# Patient Record
Sex: Male | Born: 1970
Health system: Southern US, Community
[De-identification: ages and names within clinical notes are randomized; demographics above are authoritative.]

## PROBLEM LIST (undated history)

## (undated) DIAGNOSIS — J45909 Unspecified asthma, uncomplicated: Secondary | ICD-10-CM

## (undated) HISTORY — PX: APPENDECTOMY: SHX54

## (undated) HISTORY — PX: PATELLAR TENDON REPAIR: SHX737

---

## 1999-12-28 ENCOUNTER — Emergency Department (HOSPITAL_COMMUNITY): Admission: EM | Admit: 1999-12-28 | Discharge: 1999-12-28 | Payer: Self-pay | Admitting: Emergency Medicine

## 1999-12-28 ENCOUNTER — Encounter: Payer: Self-pay | Admitting: Emergency Medicine

## 2001-04-23 ENCOUNTER — Emergency Department (HOSPITAL_COMMUNITY): Admission: EM | Admit: 2001-04-23 | Discharge: 2001-04-23 | Payer: Self-pay | Admitting: Emergency Medicine

## 2004-11-16 ENCOUNTER — Emergency Department (HOSPITAL_COMMUNITY): Admission: EM | Admit: 2004-11-16 | Discharge: 2004-11-16 | Payer: Self-pay | Admitting: Emergency Medicine

## 2004-11-30 ENCOUNTER — Observation Stay (HOSPITAL_COMMUNITY): Admission: RE | Admit: 2004-11-30 | Discharge: 2004-12-01 | Payer: Self-pay | Admitting: Orthopedic Surgery

## 2006-03-20 ENCOUNTER — Emergency Department (HOSPITAL_COMMUNITY): Admission: EM | Admit: 2006-03-20 | Discharge: 2006-03-20 | Payer: Self-pay | Admitting: Family Medicine

## 2007-05-10 ENCOUNTER — Emergency Department (HOSPITAL_COMMUNITY): Admission: EM | Admit: 2007-05-10 | Discharge: 2007-05-10 | Payer: Self-pay | Admitting: Emergency Medicine

## 2007-09-11 ENCOUNTER — Emergency Department (HOSPITAL_COMMUNITY): Admission: EM | Admit: 2007-09-11 | Discharge: 2007-09-11 | Payer: Self-pay | Admitting: Emergency Medicine

## 2008-01-27 ENCOUNTER — Emergency Department (HOSPITAL_COMMUNITY): Admission: EM | Admit: 2008-01-27 | Discharge: 2008-01-27 | Payer: Self-pay | Admitting: Emergency Medicine

## 2009-02-10 ENCOUNTER — Emergency Department (HOSPITAL_COMMUNITY): Admission: EM | Admit: 2009-02-10 | Discharge: 2009-02-10 | Payer: Self-pay | Admitting: *Deleted

## 2009-05-13 ENCOUNTER — Emergency Department (HOSPITAL_COMMUNITY): Admission: EM | Admit: 2009-05-13 | Discharge: 2009-05-13 | Payer: Self-pay | Admitting: Emergency Medicine

## 2009-11-05 ENCOUNTER — Emergency Department (HOSPITAL_COMMUNITY): Admission: EM | Admit: 2009-11-05 | Discharge: 2009-11-05 | Payer: Self-pay | Admitting: Family Medicine

## 2009-11-09 ENCOUNTER — Encounter (INDEPENDENT_AMBULATORY_CARE_PROVIDER_SITE_OTHER): Payer: Self-pay | Admitting: Family Medicine

## 2009-11-09 ENCOUNTER — Ambulatory Visit: Payer: Self-pay | Admitting: Internal Medicine

## 2009-11-09 LAB — CONVERTED CEMR LAB
ALT: 11 units/L (ref 0–53)
AST: 13 units/L (ref 0–37)
Albumin: 4.6 g/dL (ref 3.5–5.2)
Alkaline Phosphatase: 64 units/L (ref 39–117)
BUN: 7 mg/dL (ref 6–23)
CO2: 30 meq/L (ref 19–32)
Calcium: 9.4 mg/dL (ref 8.4–10.5)
Chloride: 100 meq/L (ref 96–112)
Cholesterol: 194 mg/dL (ref 0–200)
Creatinine, Ser: 1.01 mg/dL (ref 0.40–1.50)
Glucose, Bld: 96 mg/dL (ref 70–99)
HDL: 49 mg/dL (ref >39)
LDL Cholesterol: 129 mg/dL — ABNORMAL HIGH (ref 0–99)
Potassium: 4.3 meq/L (ref 3.5–5.3)
Sodium: 138 meq/L (ref 135–145)
TSH: 1.389 microintl units/mL (ref 0.350–4.500)
Total Bilirubin: 0.5 mg/dL (ref 0.3–1.2)
Total CHOL/HDL Ratio: 4
Total Protein: 7.7 g/dL (ref 6.0–8.3)
Triglycerides: 81 mg/dL (ref <150)
VLDL: 16 mg/dL (ref 0–40)
Vit D, 25-Hydroxy: 10 ng/mL — ABNORMAL LOW (ref 30–89)

## 2010-03-17 ENCOUNTER — Ambulatory Visit: Payer: Self-pay | Admitting: Internal Medicine

## 2011-01-26 NOTE — Op Note (Signed)
NAME:  Jeremy Randall, YEATTS NO.:  [id number]   MEDICAL RECORD NO.:  [id number]          PATIENT TYPE:  OBV   LOCATION:  0098                         FACILITY:  [facility]   PHYSICIAN:  Vania Rea. Supple, M.D.  DATE OF BIRTH:  08-20-1971   DATE OF PROCEDURE:  11/30/2004  DATE OF DISCHARGE:                                 OPERATIVE REPORT   PREOPERATIVE DIAGNOSIS:  Left patellar tendon rupture.   POSTOPERATIVE DIAGNOSIS:  Left patellar tendon rupture.   PROCEDURE:  Left patellar tendon repair.   SURGEON:  Vania Rea. Supple, M.D.   Threasa HeadsFrench Ana A. Shuford, P.A.-C.   ANESTHESIA:  LMA general anesthesia.   TOURNIQUET TIME:  Less than 60 minutes.   ESTIMATED BLOOD LOSS:  Minimal.   DRAINS:  None.   HISTORY:  Mr. Miklas is a 40 year old gentleman who injured his left knee  playing basketball approximately two weeks ago and experienced immediate  pain and inability to extend the knee.  He was seen at a local emergency  room, where x-rays were obtained, showing evidence for a patella Alta.  He  was placed in a knee immobilizer and followed up in our office yesterday, at  which time his examination did show significant swelling as well as  tenderness over the infrapatellar region as well as an inability to extend  the knee against gravity.  X-rays were reviewed, again, confirming patella  Alta.  The diagnosis is consistent with a patellar tendon rupture.  He is  brought to the operating room at this time for planned patellar tendon  repair.   Preoperatively, I discussed with Mr. Minnehan treatment options as well as  risks versus benefits thereof, possible complications, bleeding, infection,  neurovascular injury, DVT, PE, loss of knee motion, recurrence within the  rupture and the possible need for __________.  He understands and accepts  and agrees with our planned procedure.   PROCEDURE IN DETAIL:  After undergoing routine prep and evaluation, the  patient received  prophylactic antibiotics.  Placed supine on the operating  room table.  Underwent induction of LMA general anesthesia.  The tourniquet  applied to the left thigh and the left leg sterilely prepped and draped in a  standard fashion.  The leg was exsanguinated with the tourniquet inflated at  350 mmHg.  An anterior midline incision was then made from three  fingerbreadths above the patella to over the tibial tubercle.  Skin flaps  are mobilized.  There was significant scarring of the tissue planes, and we  carefully dividing the paratenon away from the underlying tendon and the  overlying subcutaneous tissues.  There was an obvious rupture of the mid  substance of the infrapatellar tendon with a 2-3 mm stump remaining at the  inferior pole of the patella.  The fat pad was freed from the patellar  tendon, and access of the joint was then gained, removing some yellow  synovial fluid, which was blood-tinged.  The joint was inspected and  irrigated.  The margin of the ruptured patellar tendon was then identified  and mobilized.  Two  modified Krackow grasping sutures were placed into both  the proximal and distal stumps.  These were then tied, allowing excellent  reapposition of the tendon stump and against one another, and care taken to  make sure the repair set was not over-reduced to prevent a patellar baja.  The retinaculum was then repaired with figure-of-eight #1 Vicryl sutures  medially and laterally, and 0 Vicryl was also used to over-sew the patellar  tendon margin.  Then 2-0 Vicryl in a running fashion was then used to repair  the paratenon over the repair site.  Then 2-0 Vicryl was used to close the  subcutaneous layer and intracuticular 3-0 Monocryl used to close the skin,  followed by Steri-Strips.  Dry dressings were applied over the left knee  followed by Ace bandages and placed in a knee immobilizer in full extension.  The tourniquet was then let down.  The patient was extubated and  taken to  the recovery room in stable condition.      KMS/MEDQ  D:  11/30/2004  T:  11/30/2004  Job:  [id number]

## 2011-06-06 LAB — POCT RAPID STREP A: Streptococcus, Group A Screen (Direct): NEGATIVE

## 2012-01-04 ENCOUNTER — Encounter (HOSPITAL_COMMUNITY): Payer: Self-pay

## 2012-01-04 ENCOUNTER — Emergency Department (INDEPENDENT_AMBULATORY_CARE_PROVIDER_SITE_OTHER)
Admission: EM | Admit: 2012-01-04 | Discharge: 2012-01-04 | Disposition: A | Discharge status: Home / Self Care | Payer: Self-pay | Source: Home / Self Care | Attending: Emergency Medicine | Admitting: Emergency Medicine

## 2012-01-04 DIAGNOSIS — K089 Disorder of teeth and supporting structures, unspecified: Secondary | ICD-10-CM

## 2012-01-04 DIAGNOSIS — K2970 Gastritis, unspecified, without bleeding: Secondary | ICD-10-CM

## 2012-01-04 DIAGNOSIS — K0889 Other specified disorders of teeth and supporting structures: Secondary | ICD-10-CM

## 2012-01-04 MED ORDER — PENICILLIN V POTASSIUM 500 MG PO TABS: 500.0000 mg | ORAL_TABLET | Freq: Four times a day (QID) | ORAL | Status: AC

## 2012-01-04 MED ORDER — MELOXICAM 15 MG PO TABS: 15.0000 mg | ORAL_TABLET | Freq: Every day | ORAL | Status: DC

## 2012-01-04 MED ORDER — SUCRALFATE 1 GM/10ML PO SUSP: 1.0000 g | Freq: Four times a day (QID) | ORAL | Status: DC

## 2012-01-04 MED ORDER — CHLORHEXIDINE GLUCONATE 0.12 % MT SOLN: OROMUCOSAL | Status: AC

## 2012-01-04 MED ORDER — LIDOCAINE VISCOUS 2 % MT SOLN: 10.0000 mL | OROMUCOSAL | Status: AC | PRN

## 2012-01-04 MED ORDER — HYDROCODONE-ACETAMINOPHEN 5-325 MG PO TABS: 2.0000 | ORAL_TABLET | ORAL | Status: AC | PRN

## 2012-01-04 NOTE — ED Provider Notes (Signed)
History     CSN: [id number]  Arrival date & time 01/04/12  1142   First MD Initiated Contact with Patient 01/04/12 1241      Chief Complaint  Patient presents with  . Nausea    (Consider location/radiation/quality/duration/timing/severity/associated sxs/prior treatment) HPI Comments: Patient reports one week of left upper toothache. Denies any recent trauma to the tooth. States this tooth is "bad," and bothers him from time to time. Reports left sided headache and ear pain secondary to the dental pain. No nausea, vomiting, facial swelling, nasal congestion.Marland Kitchen No photophobia, neck pain, neck stiffness. No change in hearing. Has been taking ibuprofen, Excedrin, and Goody's for this pain over the past week, and now states that  his stomach hurts, he feels nauseous. No vomiting, hematemesis. Reports decreased appetite. No melena, hematochezia. No abdominal distention or swelling. Pain is worse after taking medications. No alleviating factors. Hasn't tried anything for his symptoms. No history of peptic ulcer disease.  ROS as noted in HPI. All other ROS negative.   Patient is a 41 y.o. male presenting with tooth pain. The history is provided by the patient. No language interpreter was used.  Dental PainThe primary symptoms include mouth pain. The symptoms began 5 to 7 days ago. The symptoms are worsening. The symptoms are recurrent. The symptoms occur constantly.  Additional symptoms include: dental sensitivity to temperature, gum tenderness and ear pain. Additional symptoms do not include: gum swelling, purulent gums, trismus, jaw pain, facial swelling, trouble swallowing and pain with swallowing. Medical issues include: smoking and periodontal disease.    History reviewed. No pertinent past medical history.  Past Surgical History  Procedure Date  . Patellar tendon repair     History reviewed. No pertinent family history.  History  Substance Use Topics  . Smoking status: Current  Everyday Smoker  . Smokeless tobacco: Not on file  . Alcohol Use: Yes      Review of Systems  HENT: Positive for ear pain. Negative for facial swelling and trouble swallowing.     Allergies  Review of patient's allergies indicates no known allergies.  Home Medications   Current Outpatient Rx  Name Route Sig Dispense Refill  . CHLORHEXIDINE GLUCONATE 0.12 % MT SOLN  15 mL swish and spit bid 120 mL 0  . HYDROCODONE-ACETAMINOPHEN 5-325 MG PO TABS Oral Take 2 tablets by mouth every 4 (four) hours as needed for pain. 20 tablet 0  . LIDOCAINE VISCOUS 2 % MT SOLN Oral Take 10 mLs by mouth as needed for pain. Hold in mouth and spit. Do not swallow. 100 mL 0  . MELOXICAM 15 MG PO TABS Oral Take 1 tablet (15 mg total) by mouth daily. 14 tablet 0  . PENICILLIN V POTASSIUM 500 MG PO TABS Oral Take 1 tablet (500 mg total) by mouth 4 (four) times daily. X 10 days 40 tablet 0  . SUCRALFATE 1 GM/10ML PO SUSP Oral Take 10 mLs (1 g total) by mouth 4 (four) times daily. 10 mL before meals and before bedtime 240 mL 0    BP 123/79  Pulse 64  Temp(Src) 98.6 F (37 C) (Oral)  Resp 16  SpO2 96%  Physical Exam  Nursing note and vitals reviewed. Constitutional: He is oriented to person, place, and time. He appears well-developed and well-nourished.  HENT:  Head: Normocephalic and atraumatic. No trismus in the jaw.  Mouth/Throat: Oropharynx is clear and moist and mucous membranes are normal. Abnormal dentition. Dental caries present. No dental abscesses or uvula  swelling.    Eyes: Conjunctivae and EOM are normal.  Neck: Normal range of motion.  Cardiovascular: Normal rate, regular rhythm and normal heart sounds.   Pulmonary/Chest: Effort normal and breath sounds normal. No respiratory distress.  Abdominal: Soft. Normal appearance and bowel sounds are normal. He exhibits no distension and no mass. There is no rebound, no guarding and no CVA tenderness.    Musculoskeletal: Normal range of motion.    Neurological: He is alert and oriented to person, place, and time.  Skin: Skin is warm and dry.  Psychiatric: He has a normal mood and affect. His behavior is normal.    ED Course  Procedures (including critical care time)  Labs Reviewed - No data to display No results found.   1. Pain, dental   2. Gastritis       MDM  H&P most consistent with a dental caries. Infection. Will refer him to the dentist on call. Abdominal pain most consistent with gastritis, from all of the NSAID use. Patient is also smoker. Advised patient that smoking, drinking, will aggravate his symptoms. Discussed importance of smoking cessation.  home with Carafate. Will also send home with a short course of Norco. Sending home with viscous lidocaine for additional pain relief. Chlorhexidine mouthwash and penicillin for infection Will discontinue all of his NSAIDs, and start him on a once a day, long acting NSAID.  Luiz Blare, MD 01/04/12 [phone]

## 2012-01-04 NOTE — ED Notes (Signed)
C/o tooth ache for 1 week , using excedrin (sometimes w no food) and has felt nauseated ; denies injury to abdominal area

## 2012-01-04 NOTE — Discharge Instructions (Signed)
Go to www.goodrx.com to look up your medications. This will give you a list of where you can find your prescriptions at the most affordable prices.  ° °RESOURCE GUIDE ° °Dental Problems ° °Look up http://www.ncdental.org/ncds/Schedule.asp for a schedule of the [facility]'s free dental clinics called [facility]. They have clinics all around [place]. Get there early and be prepared to wait.  ° °[facility] °[address]  [place], [place] [address] °[phone] ° °[facility] °[address] °[place], [place] °[phone] ° °Patients with Medicaid: °[facility]                     [facility] °[address]                                [address] °Phone:  [phone]                                                  Phone:  [phone] ° °If unable to pay or uninsured, contact:  [facility] or [facility] to become qualified for the adult dental clinic. ° °

## 2012-05-10 ENCOUNTER — Encounter (HOSPITAL_COMMUNITY): Payer: Self-pay | Admitting: *Deleted

## 2012-05-10 ENCOUNTER — Observation Stay (HOSPITAL_COMMUNITY)
Admission: EM | Admit: 2012-05-10 | Discharge: 2012-05-12 | Disposition: A | Discharge status: Home / Self Care | Payer: Self-pay | Attending: Cardiovascular Disease | Admitting: Cardiovascular Disease

## 2012-05-10 DIAGNOSIS — R0789 Other chest pain: Secondary | ICD-10-CM

## 2012-05-10 DIAGNOSIS — I4891 Unspecified atrial fibrillation: Principal | ICD-10-CM

## 2012-05-10 DIAGNOSIS — R0602 Shortness of breath: Secondary | ICD-10-CM | POA: Insufficient documentation

## 2012-05-10 DIAGNOSIS — R42 Dizziness and giddiness: Secondary | ICD-10-CM

## 2012-05-10 DIAGNOSIS — R112 Nausea with vomiting, unspecified: Secondary | ICD-10-CM

## 2012-05-10 DIAGNOSIS — F17200 Nicotine dependence, unspecified, uncomplicated: Secondary | ICD-10-CM | POA: Insufficient documentation

## 2012-05-10 DIAGNOSIS — R61 Generalized hyperhidrosis: Secondary | ICD-10-CM | POA: Insufficient documentation

## 2012-05-10 DIAGNOSIS — R110 Nausea: Secondary | ICD-10-CM

## 2012-05-10 DIAGNOSIS — R1013 Epigastric pain: Secondary | ICD-10-CM | POA: Insufficient documentation

## 2012-05-10 DIAGNOSIS — E876 Hypokalemia: Secondary | ICD-10-CM

## 2012-05-10 DIAGNOSIS — R9431 Abnormal electrocardiogram [ECG] [EKG]: Secondary | ICD-10-CM

## 2012-05-10 DIAGNOSIS — I4581 Long QT syndrome: Secondary | ICD-10-CM | POA: Insufficient documentation

## 2012-05-10 DIAGNOSIS — R1110 Vomiting, unspecified: Secondary | ICD-10-CM

## 2012-05-10 DIAGNOSIS — R079 Chest pain, unspecified: Secondary | ICD-10-CM | POA: Insufficient documentation

## 2012-05-10 LAB — CBC
Hemoglobin: 15.5 g/dL (ref 13.0–17.0)
MCH: 31 pg (ref 26.0–34.0)
RBC: 5 MIL/uL (ref 4.22–5.81)

## 2012-05-10 LAB — CBC WITH DIFFERENTIAL/PLATELET
Basophils Absolute: 0.1 10*3/uL (ref 0.0–0.1)
HCT: 42.7 % (ref 39.0–52.0)
Lymphocytes Relative: 41 % (ref 12–46)
Monocytes Absolute: 0.8 10*3/uL (ref 0.1–1.0)
Neutro Abs: 5.1 10*3/uL (ref 1.7–7.7)
Neutrophils Relative %: 49 % (ref 43–77)
RDW: 13.4 % (ref 11.5–15.5)
WBC: 10.4 10*3/uL (ref 4.0–10.5)

## 2012-05-10 LAB — POCT I-STAT TROPONIN I: Troponin i, poc: 0.01 ng/mL (ref 0.00–0.08)

## 2012-05-10 LAB — POCT I-STAT, CHEM 8
BUN: 12 mg/dL (ref 6–23)
Calcium, Ion: 1.18 mmol/L (ref 1.12–1.23)
Chloride: 105 mEq/L (ref 96–112)
HCT: 47 % (ref 39.0–52.0)
Potassium: 3.3 mEq/L — ABNORMAL LOW (ref 3.5–5.1)
Sodium: 142 mEq/L (ref 135–145)

## 2012-05-10 MED ORDER — ONDANSETRON HCL 4 MG/2ML IJ SOLN
4.0000 mg | Freq: Once | INTRAMUSCULAR | Status: AC
  Administered 2012-05-10: 4 mg via INTRAVENOUS

## 2012-05-10 MED ORDER — METOPROLOL TARTRATE 1 MG/ML IV SOLN
INTRAVENOUS | Status: AC
  Administered 2012-05-10: 5 mg
  Filled 2012-05-10: qty 5

## 2012-05-10 MED ORDER — SUCRALFATE 1 GM/10ML PO SUSP
1.0000 g | Freq: Four times a day (QID) | ORAL | Status: DC
  Administered 2012-05-11 (×2): 1 g via ORAL
  Filled 2012-05-10 (×12): qty 10

## 2012-05-10 MED ORDER — ONDANSETRON HCL 4 MG PO TABS: 4.0000 mg | ORAL_TABLET | Freq: Four times a day (QID) | ORAL | Status: DC | PRN

## 2012-05-10 MED ORDER — HEPARIN SODIUM (PORCINE) 5000 UNIT/ML IJ SOLN
5000.0000 [IU] | Freq: Once | INTRAMUSCULAR | Status: AC
  Administered 2012-05-10: 5000 [IU] via INTRAVENOUS

## 2012-05-10 MED ORDER — POTASSIUM CHLORIDE IN NACL 40-0.9 MEQ/L-% IV SOLN
INTRAVENOUS | Status: DC
  Administered 2012-05-11: via INTRAVENOUS
  Filled 2012-05-10 (×4): qty 1000

## 2012-05-10 MED ORDER — ONDANSETRON HCL 4 MG/2ML IJ SOLN
INTRAMUSCULAR | Status: AC
  Filled 2012-05-10: qty 2

## 2012-05-10 MED ORDER — ASPIRIN 81 MG PO CHEW
324.0000 mg | CHEWABLE_TABLET | Freq: Once | ORAL | Status: AC
  Administered 2012-05-10: 324 mg via ORAL
  Filled 2012-05-10: qty 1

## 2012-05-10 MED ORDER — SODIUM CHLORIDE 0.9 % IJ SOLN
3.0000 mL | Freq: Two times a day (BID) | INTRAMUSCULAR | Status: DC
  Administered 2012-05-11 (×2): 3 mL via INTRAVENOUS

## 2012-05-10 MED ORDER — ASPIRIN 81 MG PO CHEW
CHEWABLE_TABLET | ORAL | Status: AC
  Administered 2012-05-10: 324 mg via ORAL
  Filled 2012-05-10: qty 4

## 2012-05-10 MED ORDER — ACETAMINOPHEN 650 MG RE SUPP: 650.0000 mg | Freq: Four times a day (QID) | RECTAL | Status: DC | PRN

## 2012-05-10 MED ORDER — ACETAMINOPHEN 325 MG PO TABS: 650.0000 mg | ORAL_TABLET | Freq: Four times a day (QID) | ORAL | Status: DC | PRN

## 2012-05-10 MED ORDER — ONDANSETRON HCL 4 MG/2ML IJ SOLN: 4.0000 mg | Freq: Four times a day (QID) | INTRAMUSCULAR | Status: DC | PRN

## 2012-05-10 MED ORDER — ZOLPIDEM TARTRATE 5 MG PO TABS: 5.0000 mg | ORAL_TABLET | Freq: Every evening | ORAL | Status: DC | PRN

## 2012-05-10 MED ORDER — ASPIRIN 81 MG PO CHEW
162.0000 mg | CHEWABLE_TABLET | Freq: Every day | ORAL | Status: DC
  Administered 2012-05-11 – 2012-05-12 (×2): 162 mg via ORAL
  Filled 2012-05-10 (×2): qty 2

## 2012-05-10 MED ORDER — HEPARIN SODIUM (PORCINE) 5000 UNIT/ML IJ SOLN
5000.0000 [IU] | Freq: Three times a day (TID) | INTRAMUSCULAR | Status: DC
  Administered 2012-05-11: 5000 [IU] via SUBCUTANEOUS
  Filled 2012-05-10 (×4): qty 1

## 2012-05-10 NOTE — ED Notes (Addendum)
Pt states dizzy and nausated since around 19:00. Pt denies pain but states he just got really diaphoretic, nauseated and then dizzy. Pt denies LOC, or weakness on side. Pt states he just doesn't feel right. Pt does admit to marjiuana use today.

## 2012-05-10 NOTE — ED Provider Notes (Signed)
History     CSN: [id number]  Arrival date & time 05/10/12  2012   First MD Initiated Contact with Patient 05/10/12 2108      Chief Complaint  Patient presents with  . Nausea  . Dizziness    Patient is a 41 year old male with no reported past medical history who presents with complaints of nausea dizziness diaphoresis and chest pressure. Patient states that symptoms onset approximately one hour prior to arrival. He denies any inciting event. Patient denies any history of prior chest pain in the past. Patient denies any fevers shortness of breath or any other symptoms.  (Consider location/radiation/quality/duration/timing/severity/associated sxs/prior treatment) Patient is a 41 y.o. male presenting with GI illness. The history is provided by the patient. No language interpreter was used.  GI Problem  This is a new problem. The current episode started 1 to 2 hours ago. The problem occurs continuously. The problem has been gradually worsening. There has been no fever. Associated symptoms include vomiting and sweats. He has tried nothing for the symptoms. The treatment provided no relief.    History reviewed. No pertinent past medical history.  Past Surgical History  Procedure Date  . Patellar tendon repair     History reviewed. No pertinent family history.  History  Substance Use Topics  . Smoking status: Current Everyday Smoker  . Smokeless tobacco: Not on file  . Alcohol Use: Yes      Review of Systems  Gastrointestinal: Positive for vomiting.  All other systems reviewed and are negative.    Allergies  Review of patient's allergies indicates no known allergies.  Home Medications   Current Outpatient Rx  Name Route Sig Dispense Refill  . MELOXICAM 15 MG PO TABS Oral Take 1 tablet (15 mg total) by mouth daily. 14 tablet 0  . SUCRALFATE 1 GM/10ML PO SUSP Oral Take 10 mLs (1 g total) by mouth 4 (four) times daily. 10 mL before meals and before bedtime 240 mL 0     BP 120/106  Pulse 113  Temp 97.8 F (36.6 C) (Oral)  Resp 18  SpO2 100%  Physical Exam  Nursing note and vitals reviewed. Constitutional: He is oriented to person, place, and time. He appears well-developed and well-nourished.  HENT:  Head: Normocephalic and atraumatic.  Right Ear: External ear normal.  Left Ear: External ear normal.  Eyes: Conjunctivae and EOM are normal. Pupils are equal, round, and reactive to light.  Neck: Normal range of motion. Neck supple.  Cardiovascular: S1 normal, S2 normal and intact distal pulses.  An irregularly irregular rhythm present. Tachycardia present.   Pulmonary/Chest: Effort normal and breath sounds normal.  Abdominal: Soft. Bowel sounds are normal.  Musculoskeletal: Normal range of motion. He exhibits no edema and no tenderness.  Neurological: He is alert and oriented to person, place, and time. He has normal reflexes.  Skin: Skin is warm. He is diaphoretic.  Psychiatric: He has a normal mood and affect.    ED Course  Procedures (including critical care time)  Labs Reviewed  CBC WITH DIFFERENTIAL - Abnormal; Notable for the following:    Lymphs Abs 4.3 (*)     All other components within normal limits  POCT I-STAT, CHEM 8 - Abnormal; Notable for the following:    Potassium 3.3 (*)     Glucose, Bld 188 (*)     All other components within normal limits  CBC - Abnormal; Notable for the following:    WBC 11.3 (*)     All  other components within normal limits  CREATININE, SERUM - Abnormal; Notable for the following:    GFR calc non Af Amer 78 (*)     All other components within normal limits  POCT I-STAT TROPONIN I  BASIC METABOLIC PANEL  TSH  URINE RAPID DRUG SCREEN (HOSP PERFORMED)   Dg Chest 2 View  05/11/2012  *RADIOLOGY REPORT*  Clinical Data: Atrial fibrillation  CHEST - 2 VIEW  Comparison: None  Findings: The heart size and mediastinal contours are within normal limits.  Both lungs are clear.  The visualized skeletal  structures are unremarkable.  IMPRESSION:  Negative exam.   Original Report Authenticated By: Rosealee Albee, M.D.        Date: 05/11/2012  Rate: 73   Rhythm: normal sinus rhythm  QRS Axis: normal  Intervals: normal  ST/T Wave abnormalities: early repolarization/ST elevations in inferior leads.    Conduction Disutrbances:none  Narrative Interpretation:   Old EKG Reviewed: none available    Date: 05/11/2012  Rate: 108  Rhythm: atrial fibrillation  QRS Axis: normal  Intervals: no pr  ST/T Wave abnormalities: early repolarization  Conduction Disutrbances:none  Narrative Interpretation:   Old EKG Reviewed: changes noted     1. Nausea   2. Vomiting   3. Lightheadedness   4. Diaphoresis   5. Chest pressure       MDM  Patient is a 41 year old male with no reported past medical history who presents with sudden onset of nausea, vomiting, lightheadedness, and diaphoresis. Upon arrival in the emergency department patient was noted to be afebrile vital signs unremarkable. However a EKG done in triage showed ST abnormalities in inferior leads, and thus was brought back immediately to trauma resuscitation room. Upon arrival in the resuscitation room EKG was reviewed and there were ST changes in the inferior leads however there was concern for possible early repolarization versus ischemia. Best EKG was repeated, repeat EKG showed atrial fibrillation with RVR and persistent ST changes. Thus code STEMI was called. Cardiology felt that the patient's EKGs consistent more with early repolarization and no need for activation of Cath Lab. Review of patient's including troponin showed no abnormalities. Review of the patient's chest x-ray showed no acute coronary pulmonary abnormality patient's tachycardia he was given metoprolol and status post treatment heart rate noted to be below 100. Patient's other vital signs remained stable and he was felt to warrant cardiology admission. Admitted without  acute event.        Johnney Ou, MD 05/11/12 0040

## 2012-05-10 NOTE — H&P (Signed)
[facility]       History and physical   Reason for admission: Atrial fibrillation with rapid ventricular response  Requesting Physician: Emergency room physician  Cardiologist: No prior  HPI: [name] with a past medical history significant for the absence of any known previous cardiac problems and no history of hypertension diabetes mellitus congestive heart failure stroke or TIA.  He had sudden onset of nausea and vomiting associated with diaphoresis and mild shortness of breath and some epigastric discomfort due to the retching, around 1800 hours. An ECG was performed initially to show sinus rhythm with very subtle generalized mild ST segment elevation suggestive of early repolarization versus pericarditis. Shortly after arriving to the emergency room a second EKG showed atrial fibrillation. At some point a code STEMI was called but the patient a new echocardiogram were evaluated by Dr. Shawnie Pons, and a code STEMI was canceled.  He has not had any chest tightness or pain whatsoever this afternoon. I have asked him to several times. There is a notation in the chart that he mentioned to someone that he had chest tightness. He tells in this is not accurate.  His review of systems is primary significant for the fact that he has had palpitations off and on in the past. Otherwise he denies any dyspnea at rest or with exertion, chest pain at rest or with exertion, fatigue or intolerance to activity, recent major weight changes, intolerance to heat or cold, changes to hair 4 skin texture, mood swings, gastrointestinal bleeding, abdominal pain, diarrhea, constipation or dysphagia, polyuria, polydipsia, frequency, urgency hematuria, dysuria, fever, chills,cough, hemoptysis, pleuritic chest pain or wheezing, neurological deficits, intermittent claudication or erectile dysfunction.  PMHx:  History reviewed. No pertinent past medical history. Past  Surgical History  Procedure Date  . Patellar tendon repair     FAMHx: History reviewed. No pertinent family history.specifically negative for premature coronary disease congestive heart failure atrial fibrillation or sudden cardiac death  SOCHx:  reports that he has been smoking.  He does not have any smokeless tobacco history on file. He reports that he drinks alcohol. He reports that he uses illicit drugs (Marijuana).  ALLERGIES: No Known Allergies  ROS: His review of systems is primary significant for the fact that he has had palpitations off and on in the past. Otherwise he denies any dyspnea at rest or with exertion, chest pain at rest or with exertion, fatigue or intolerance to activity, recent major weight changes, intolerance to heat or cold, changes to hair 4 skin texture, mood swings, gastrointestinal bleeding, abdominal pain, diarrhea, constipation or dysphagia, polyuria, polydipsia, frequency, urgency hematuria, dysuria, fever, chills,cough, hemoptysis, pleuritic chest pain or wheezing, neurological deficits, intermittent claudication or erectile dysfunction.   HOME MEDICATIONS: Carafate  HOSPITAL MEDICATIONS: I have reviewed the patient's current medications.  VITALS: Blood pressure 106/68, pulse 67, temperature 97.8 F (36.6 C), temperature source Oral, resp. rate 8, SpO2 100.00%.  PHYSICAL EXAM: General appearance: alert, cooperative and no distress Neck: no adenopathy, no carotid bruit, no JVD, supple, symmetrical, trachea midline and thyroid not enlarged, symmetric, no tenderness/mass/nodules Lungs: clear to auscultation bilaterally Heart: irregularly irregular rhythm, normal first and second heart sounds, no murmurs or rubs or gallops Abdomen: soft, non-tender; bowel sounds normal; no masses,  no organomegaly Extremities: extremities normal, atraumatic, no cyanosis or edema Pulses: 2+ and symmetric Skin: Skin color, texture, turgor normal. No rashes or  lesions Neurologic: Alert and oriented X  3, normal strength and tone. Normal symmetric reflexes. Normal coordination and gait  LABS: Results for orders placed during the hospital encounter of 05/10/12 (from the past 48 hour(s))  CBC WITH DIFFERENTIAL     Status: Abnormal   Collection Time   05/10/12  8:28 PM      Component Value Range Comment   WBC 10.4  4.0 - 10.5 K/uL    RBC 4.90  4.22 - 5.81 MIL/uL    Hemoglobin 15.1  13.0 - 17.0 g/dL    HCT 40.9  81.1 - 91.4 %    MCV 87.1  78.0 - 100.0 fL    MCH 30.8  26.0 - 34.0 pg    MCHC 35.4  30.0 - 36.0 g/dL    RDW 78.2  95.6 - 21.3 %    Platelets 228  150 - 400 K/uL    Neutrophils Relative 49  43 - 77 %    Neutro Abs 5.1  1.7 - 7.7 K/uL    Lymphocytes Relative 41  12 - 46 %    Lymphs Abs 4.3 (*) 0.7 - 4.0 K/uL    Monocytes Relative 8  3 - 12 %    Monocytes Absolute 0.8  0.1 - 1.0 K/uL    Eosinophils Relative 1  0 - 5 %    Eosinophils Absolute 0.1  0.0 - 0.7 K/uL    Basophils Relative 1  0 - 1 %    Basophils Absolute 0.1  0.0 - 0.1 K/uL   POCT I-STAT TROPONIN I     Status: Normal   Collection Time   05/10/12  8:43 PM      Component Value Range Comment   Troponin i, poc 0.01  0.00 - 0.08 ng/mL    Comment 3            POCT I-STAT, CHEM 8     Status: Abnormal   Collection Time   05/10/12  8:44 PM      Component Value Range Comment   Sodium 142  135 - 145 mEq/L    Potassium 3.3 (*) 3.5 - 5.1 mEq/L    Chloride 105  96 - 112 mEq/L    BUN 12  6 - 23 mg/dL    Creatinine, Ser 0.86  0.50 - 1.35 mg/dL    Glucose, Bld 578 (*) 70 - 99 mg/dL    Calcium, Ion 4.69  6.29 - 1.23 mmol/L    TCO2 22  0 - 100 mmol/L    Hemoglobin 16.0  13.0 - 17.0 g/dL    HCT 52.8  41.3 - 24.4 %     IMAGING: No results found.  DIAGNOSES: Principal Problem:  *Atrial fibrillation Active Problems:  Abnormal finding on EKG  Nausea and vomiting  Prolonged Q-T interval on ECG  Hypokalemia   IMPRESSION: 1. Atrial fibrillation with spontaneously controlled  ventricular response 2. Nausea and vomiting suspect gastroenteritis 3. Hypokalemia, likely due to vomiting 4. Long QT interval, likely due to hypokalemia  RECOMMENDATION: 1. Admit for evaluation for structural heart disease and to establish need for anticoagulation, to correct metabolic abnormalities and to consider possible cardioversion  It is possible that the patient's arrhythmia will spontaneously revert following correction of the electrolyte abnormality.  It is surprising that at his age his atrial fibrillation ventricular rate response is so slow. We'll definitely need to check thyroid function studies. An echocardiogram was also ordered  Time Spent Directly with Patient: 45 minutes  Chrystie Nose, MD, [facility] Attending Cardiologist  [facility]  Jeremy Randall 05/10/2012, 11:15 PM

## 2012-05-10 NOTE — ED Notes (Signed)
Stemi cancelled

## 2012-05-10 NOTE — ED Notes (Signed)
Called Code STEMI

## 2012-05-10 NOTE — ED Notes (Signed)
Pt c/o nausea, vomiting, diaphoresis, lightheadedness. Pt states "my chest feels tight." Denies pain currently.

## 2012-05-10 NOTE — Progress Notes (Signed)
Responded to code stemi page.  Pt was waiting and shivering. I introduced myself and asked if I could get another blanket for him. After getting his blanket, pt expressed apprehension at the presence of a Chaplain. I assured him I was there to offer comfort and spiritual care. He smiled and said thank you. I asked if there was anything more I could do for him while we waited for his mother and sister to arrive. He said just pray for me. Shortly after prayer his mother and sister arrived.  They were very attentive in continuing to provide comfort and care to pt.  Marjory Lies, Chaplain May 10, 2012

## 2012-05-11 ENCOUNTER — Encounter (HOSPITAL_COMMUNITY): Payer: Self-pay | Admitting: *Deleted

## 2012-05-11 ENCOUNTER — Observation Stay (HOSPITAL_COMMUNITY): Payer: Self-pay

## 2012-05-11 LAB — BASIC METABOLIC PANEL
GFR calc Af Amer: >90 mL/min (ref >90)
GFR calc non Af Amer: 82 mL/min — ABNORMAL LOW (ref >90)
Glucose, Bld: 90 mg/dL (ref 70–99)
Potassium: 4.1 mEq/L (ref 3.5–5.1)
Sodium: 139 mEq/L (ref 135–145)

## 2012-05-11 LAB — RAPID URINE DRUG SCREEN, HOSP PERFORMED
Cocaine: NOT DETECTED
Opiates: POSITIVE — AB
Tetrahydrocannabinol: POSITIVE — AB

## 2012-05-11 LAB — CREATININE, SERUM: Creatinine, Ser: 1.15 mg/dL (ref 0.50–1.35)

## 2012-05-11 MED ORDER — FLECAINIDE ACETATE 100 MG PO TABS
300.0000 mg | ORAL_TABLET | Freq: Once | ORAL | Status: AC
  Administered 2012-05-11: 300 mg via ORAL
  Filled 2012-05-11: qty 3

## 2012-05-11 MED ORDER — ENOXAPARIN SODIUM 100 MG/ML ~~LOC~~ SOLN
1.0000 mg/kg | Freq: Two times a day (BID) | SUBCUTANEOUS | Status: DC
  Administered 2012-05-11: 90 mg via SUBCUTANEOUS
  Filled 2012-05-11 (×4): qty 1

## 2012-05-11 NOTE — Progress Notes (Signed)
[facility]  DAILY PROGRESS NOTE   Subjective:  Nausea and vomiting have resolved.  Objective:  Temp:  [97.8 F (36.6 C)-98.4 F (36.9 C)] 98.4 F (36.9 C) (09/01 0457) Pulse Rate:  [61-113] 81  (09/01 0457) Resp:  [8-20] 18  (09/01 0457) BP: (94-130)/(61-106) 106/65 mmHg (09/01 0457) SpO2:  [95 %-100 %] 95 % (09/01 0457) Weight:  [90.7 kg (199 lb 15.3 oz)] 90.7 kg (199 lb 15.3 oz) (09/01 0111) Weight change:   Intake/Output from previous day:    Intake/Output from this shift: Total I/O In: 240 [P.O.:240] Out: -   Medications: Current Facility-Administered Medications  Medication Dose Route Frequency Provider Last Rate Last Dose  . 0.9 % NaCl with KCl 40 mEq / L  infusion   Intravenous Continuous Jshawn Hurta, MD 100 mL/hr at 05/11/12 0028    . acetaminophen (TYLENOL) tablet 650 mg  650 mg Oral Q6H PRN Sergio Zawislak, MD       Or  . acetaminophen (TYLENOL) suppository 650 mg  650 mg Rectal Q6H PRN Donavin Audino, MD      . aspirin chewable tablet 162 mg  162 mg Oral Daily Montserrat Shek, MD   162 mg at 05/11/12 0957  . aspirin chewable tablet 324 mg  324 mg Oral Once Nelia Shi, MD   324 mg at 05/10/12 2103  . heparin injection 5,000 Units  5,000 Units Intravenous Once Nelia Shi, MD   5,000 Units at 05/10/12 2057  . heparin injection 5,000 Units  5,000 Units Subcutaneous Q8H Jamy Whyte, MD   5,000 Units at 05/11/12 0654  . metoprolol (LOPRESSOR) 1 MG/ML injection        5 mg at 05/10/12 2100  . ondansetron (ZOFRAN) injection 4 mg  4 mg Intravenous Once Nelia Shi, MD   4 mg at 05/10/12 2048  . ondansetron (ZOFRAN) tablet 4 mg  4 mg Oral Q6H PRN Loman Logan, MD       Or  . ondansetron (ZOFRAN) injection 4 mg  4 mg Intravenous Q6H PRN Spruha Weight, MD      . sodium chloride 0.9 % injection 3 mL  3 mL Intravenous Q12H Costantino Kohlbeck, MD   3 mL at 05/11/12 1000  . sucralfate (CARAFATE) 1 GM/10ML suspension 1 g  1 g Oral  QID Reanne Nellums, MD   1 g at 05/11/12 0958  . zolpidem (AMBIEN) tablet 5 mg  5 mg Oral QHS PRN,MR X 1 Jadie Allington, MD        Physical Exam: General appearance: alert, cooperative and no distress Neck: no adenopathy, no carotid bruit, no JVD, supple, symmetrical, trachea midline and thyroid not enlarged, symmetric, no tenderness/mass/nodules Lungs: clear to auscultation bilaterally Heart: irregularly irregular rhythm, S1, S2 normal, no click, no rub and no murmur Abdomen: soft, non-tender; bowel sounds normal; no masses,  no organomegaly Extremities: extremities normal, atraumatic, no cyanosis or edema Pulses: 2+ and symmetric Skin: Skin color, texture, turgor normal. No rashes or lesions Neurologic: Alert and oriented X 3, normal strength and tone. Normal symmetric reflexes. Normal coordination and gait  Lab Results: Results for orders placed during the hospital encounter of 05/10/12 (from the past 48 hour(s))  CBC WITH DIFFERENTIAL     Status: Abnormal   Collection Time   05/10/12  8:28 PM      Component Value Range Comment   WBC 10.4  4.0 - 10.5 K/uL    RBC 4.90  4.22 - 5.81  MIL/uL    Hemoglobin 15.1  13.0 - 17.0 g/dL    HCT 08.6  57.8 - 46.9 %    MCV 87.1  78.0 - 100.0 fL    MCH 30.8  26.0 - 34.0 pg    MCHC 35.4  30.0 - 36.0 g/dL    RDW 62.9  52.8 - 41.3 %    Platelets 228  150 - 400 K/uL    Neutrophils Relative 49  43 - 77 %    Neutro Abs 5.1  1.7 - 7.7 K/uL    Lymphocytes Relative 41  12 - 46 %    Lymphs Abs 4.3 (*) 0.7 - 4.0 K/uL    Monocytes Relative 8  3 - 12 %    Monocytes Absolute 0.8  0.1 - 1.0 K/uL    Eosinophils Relative 1  0 - 5 %    Eosinophils Absolute 0.1  0.0 - 0.7 K/uL    Basophils Relative 1  0 - 1 %    Basophils Absolute 0.1  0.0 - 0.1 K/uL   POCT I-STAT TROPONIN I     Status: Normal   Collection Time   05/10/12  8:43 PM      Component Value Range Comment   Troponin i, poc 0.01  0.00 - 0.08 ng/mL    Comment 3            POCT I-STAT, CHEM 8      Status: Abnormal   Collection Time   05/10/12  8:44 PM      Component Value Range Comment   Sodium 142  135 - 145 mEq/L    Potassium 3.3 (*) 3.5 - 5.1 mEq/L    Chloride 105  96 - 112 mEq/L    BUN 12  6 - 23 mg/dL    Creatinine, Ser 2.44  0.50 - 1.35 mg/dL    Glucose, Bld 010 (*) 70 - 99 mg/dL    Calcium, Ion 2.72  5.36 - 1.23 mmol/L    TCO2 22  0 - 100 mmol/L    Hemoglobin 16.0  13.0 - 17.0 g/dL    HCT 64.4  03.4 - 74.2 %   CBC     Status: Abnormal   Collection Time   05/10/12 11:29 PM      Component Value Range Comment   WBC 11.3 (*) 4.0 - 10.5 K/uL    RBC 5.00  4.22 - 5.81 MIL/uL    Hemoglobin 15.5  13.0 - 17.0 g/dL    HCT 59.5  63.8 - 75.6 %    MCV 88.4  78.0 - 100.0 fL    MCH 31.0  26.0 - 34.0 pg    MCHC 35.1  30.0 - 36.0 g/dL    RDW 43.3  29.5 - 18.8 %    Platelets 233  150 - 400 K/uL   CREATININE, SERUM     Status: Abnormal   Collection Time   05/10/12 11:29 PM      Component Value Range Comment   Creatinine, Ser 1.15  0.50 - 1.35 mg/dL    GFR calc non Af Amer 78 (*) >90 mL/min    GFR calc Af Amer >90  >90 mL/min   URINE RAPID DRUG SCREEN (HOSP PERFORMED)     Status: Abnormal   Collection Time   05/10/12 11:49 PM      Component Value Range Comment   Opiates POSITIVE (*) NONE DETECTED    Cocaine NONE DETECTED  NONE DETECTED    Benzodiazepines  NONE DETECTED  NONE DETECTED    Amphetamines NONE DETECTED  NONE DETECTED    Tetrahydrocannabinol POSITIVE (*) NONE DETECTED    Barbiturates NONE DETECTED  NONE DETECTED   BASIC METABOLIC PANEL     Status: Abnormal   Collection Time   05/11/12  6:05 AM      Component Value Range Comment   Sodium 139  135 - 145 mEq/L    Potassium 4.1  3.5 - 5.1 mEq/L    Chloride 107  96 - 112 mEq/L    CO2 26  19 - 32 mEq/L    Glucose, Bld 90  70 - 99 mg/dL    BUN 13  6 - 23 mg/dL    Creatinine, Ser 1.61  0.50 - 1.35 mg/dL    Calcium 9.0  8.4 - 09.6 mg/dL    GFR calc non Af Amer 82 (*) >90 mL/min    GFR calc Af Amer >90  >90 mL/min     TROPONIN I     Status: Normal   Collection Time   05/11/12  8:38 AM      Component Value Range Comment   Troponin I <0.30  <0.30 ng/mL     Imaging: Dg Chest 2 View  05/11/2012  *RADIOLOGY REPORT*  Clinical Data: Atrial fibrillation  CHEST - 2 VIEW  Comparison: None  Findings: The heart size and mediastinal contours are within normal limits.  Both lungs are clear.  The visualized skeletal structures are unremarkable.  IMPRESSION:  Negative exam.   Original Report Authenticated By: Rosealee Albee, M.D.     Assessment:  1. Principal Problem: 2.  *Atrial fibrillation 3. Active Problems: 4.  Abnormal finding on EKG 5.  Nausea and vomiting 6.  Prolonged Q-T interval on ECG 7.  Hypokalemia 8.   Plan:  1. AFib persists despite resolution of electrolyte abnormalities and QT normalization. Surprisingly slow ventricular response despite no meds. Echo pending. TSH pending but clinically more likely euthyroid.  ECG still with mild diffuse ST elevation, but appears most consistent with early repolarization than with pericarditis. Also no pleurisy and no rub.  Start full dose anticoagulation. Antiarrhythmics will likely help, but need to evaluate echo for structural abnormalities before picking the right agent. Flecainide is an appealing option as long as EF is normal.  Time Spent Directly with Patient:  30 minutes  Length of Stay:  LOS: 1 day    Brendt Dible 05/11/2012, 10:25 AM

## 2012-05-11 NOTE — Progress Notes (Signed)
Echo is normal (normal LV size and function, normal size left atrium, no valvular disease). Will try bolus dose flecainide to convert to NSR.  Thurmon Fair, MD, [facility] [phone] office [phone] pager

## 2012-05-11 NOTE — Progress Notes (Signed)
  Echocardiogram 2D Echocardiogram has been performed.  Jorje Guild 05/11/2012, 2:35 PM

## 2012-05-11 NOTE — Progress Notes (Signed)
05/11/2012 3:51 PM Nursing note Pt. Viewed video #[phone] as well as received Off the beat book. Questions and concerns addressed. Will continue to monitor.  Lorin Gawron, Blanchard Kelch

## 2012-05-12 MED ORDER — ASPIRIN 81 MG PO CHEW: 162.0000 mg | CHEWABLE_TABLET | Freq: Every day | ORAL | Status: AC

## 2012-05-12 NOTE — Progress Notes (Signed)
[facility]  Subjective: Feeling better.  Objective: Vital signs in last 24 hours: Temp:  [98.2 F (36.8 C)-98.8 F (37.1 C)] 98.2 F (36.8 C) (09/02 0416) Pulse Rate:  [64-85] 64  (09/02 0416) Resp:  [18] 18  (09/02 0416) BP: (112-127)/(67-78) 118/74 mmHg (09/02 0416) SpO2:  [95 %-100 %] 100 % (09/02 0416) Last BM Date: 05/10/12  Intake/Output from previous day: 09/01 0701 - 09/02 0700 In: 480 [P.O.:480] Out: -  Intake/Output this shift:    Medications Current Facility-Administered Medications  Medication Dose Route Frequency Provider Last Rate Last Dose  . acetaminophen (TYLENOL) tablet 650 mg  650 mg Oral Q6H PRN Susano Cleckler, MD       Or  . acetaminophen (TYLENOL) suppository 650 mg  650 mg Rectal Q6H PRN Harsimran Westman, MD      . aspirin chewable tablet 162 mg  162 mg Oral Daily Fayelynn Distel, MD   162 mg at 05/11/12 0957  . enoxaparin (LOVENOX) injection 90 mg  1 mg/kg Subcutaneous Q12H Ramadan Couey, MD   90 mg at 05/11/12 1223  . flecainide (TAMBOCOR) tablet 300 mg  300 mg Oral Once Thurmon Fair, MD   300 mg at 05/11/12 1802  . ondansetron (ZOFRAN) tablet 4 mg  4 mg Oral Q6H PRN Anastasio Wogan, MD       Or  . ondansetron (ZOFRAN) injection 4 mg  4 mg Intravenous Q6H PRN Wannetta Langland, MD      . sodium chloride 0.9 % injection 3 mL  3 mL Intravenous Q12H Maryum Batterson, MD   3 mL at 05/11/12 2114  . sucralfate (CARAFATE) 1 GM/10ML suspension 1 g  1 g Oral QID Lolah Coghlan, MD   1 g at 05/11/12 0958  . zolpidem (AMBIEN) tablet 5 mg  5 mg Oral QHS PRN,MR X 1 Knoxx Boeding, MD      . DISCONTD: 0.9 % NaCl with KCl 40 mEq / L  infusion   Intravenous Continuous Anthonio Mizzell, MD 100 mL/hr at 05/11/12 0028    . DISCONTD: heparin injection 5,000 Units  5,000 Units Subcutaneous Q8H Diona Peregoy, MD   5,000 Units at 05/11/12 0654    PE: General appearance: alert, cooperative and no distress Lungs: clear to auscultation  bilaterally Heart: regular rate and rhythm, S1, S2 normal, no murmur, click, rub or gallop Extremities: no LEE Pulses: 2+ and symmetric Skin: Warm and dry Neurologic: Grossly normal  Lab Results:   Basename 05/10/12 2329 05/10/12 2044 05/10/12 2028  WBC 11.3* -- 10.4  HGB 15.5 16.0 15.1  HCT 44.2 47.0 42.7  PLT 233 -- 228   BMET  Basename 05/11/12 0605 05/10/12 2329 05/10/12 2044  NA 139 -- 142  K 4.1 -- 3.3*  CL 107 -- 105  CO2 26 -- --  GLUCOSE 90 -- 188*  BUN 13 -- 12  CREATININE 1.11 1.15 1.20  CALCIUM 9.0 -- --     Assessment/Plan  Principal Problem:  *Atrial fibrillation Active Problems:  Abnormal finding on EKG  Nausea and vomiting  Prolonged Q-T interval on ECG  Hypokalemia  Plan:  Converted to NSR in the 60's after Flecainde.  BP stable.  Potassium improved.     LOS: 2 days    HAGER, BRYAN 05/12/2012 9:52 AM  I have seen and examined the patient along with Wilburt Finlay, PA.  I have reviewed the chart, notes and new data.  I agree with PA's note.  Converted promptly to NSR after  flecainide. Feels well.  PLAN: DC home. We can use flecainide as an outpatient "pill in the pocket" if symptomatic AF recurs. ASA 162 mg daily for embolism prevention. F/U in office in 1 month.  Thurmon Fair, MD, [facility] [phone] 05/12/2012, 10:09 AM

## 2012-05-12 NOTE — Discharge Summary (Signed)
Physician Discharge Summary  Patient ID: Jeremy Randall MRN: [id number] DOB/AGE: 40-Aug-1972 40 y.o.  Admit date: 05/10/2012 Discharge date: 05/12/2012  Admission Diagnoses:  Afib  Discharge Diagnoses:  Principal Problem:  *Atrial fibrillation Active Problems:  Abnormal finding on EKG  Nausea and vomiting  Prolonged Q-T interval on ECG  Hypokalemia   Discharged Condition: stable  Hospital Course:   [name] with a past medical history significant for the absence of any known previous cardiac problems and no history of hypertension diabetes mellitus congestive heart failure stroke or TIA.  He had sudden onset of nausea and vomiting associated with diaphoresis and mild shortness of breath and some epigastric discomfort due to the retching, around 1800 hours. An ECG was performed initially to show sinus rhythm with very subtle generalized mild ST segment elevation suggestive of early repolarization versus pericarditis. Shortly after arriving to the emergency room a second EKG showed atrial fibrillation. At some point a code STEMI was called but the patient a new echocardiogram were evaluated by Dr. Shawnie Pons, and a code STEMI was canceled.  There is a notation in the chart that he mentioned to someone that he had chest tightness. He stated this was not accurate.   His review of systems is primary significant for the fact that he has had palpitations off and on in the past. Otherwise he denied any dyspnea at rest or with exertion, chest pain at rest or with exertion, fatigue or intolerance to activity, recent major weight changes, intolerance to heat or cold, changes to hair or skin texture, mood swings, gastrointestinal bleeding, abdominal pain, diarrhea, constipation or dysphagia, polyuria, polydipsia, frequency, urgency hematuria, dysuria, fever, chills,cough, hemoptysis, pleuritic chest pain or wheezing, neurological deficits, intermittent claudication or erectile  dysfunction.  He was admitted for evaluation.  2D echo revealed normal EF and wall motion.  UDS was positive for opiates and marijuana.  TSH was 0.724.  He was given 300 mg of flecainide and converted to NSR within the next hour.  He was been seen by Dr. Royann Shivers and felt to be stable for DC home.  FU in one month.     Consults: None  Significant Diagnostic Studies:  Echo Study Conclusions  Left ventricle: The cavity size was normal. Wall thickness was normal. Systolic function was normal. The estimated ejection fraction was in the range of 55% to 60%. Wall motion was normal; there were no regional wall motion abnormalities. Atrial fibrillation limitsevaluation of LV diastolic function, but DTI velocities suggest normal diastolic function.  BMET    Component Value Date/Time   NA 139 05/11/2012 0605   K 4.1 05/11/2012 0605   CL 107 05/11/2012 0605   CO2 26 05/11/2012 0605   GLUCOSE 90 05/11/2012 0605   BUN 13 05/11/2012 0605   CREATININE 1.11 05/11/2012 0605   CALCIUM 9.0 05/11/2012 0605   GFRNONAA 82* 05/11/2012 0605   GFRAA >90 05/11/2012 0605    CBC    Component Value Date/Time   WBC 11.3* 05/10/2012 2329   RBC 5.00 05/10/2012 2329   HGB 15.5 05/10/2012 2329   HCT 44.2 05/10/2012 2329   PLT 233 05/10/2012 2329   MCV 88.4 05/10/2012 2329   MCH 31.0 05/10/2012 2329   MCHC 35.1 05/10/2012 2329   RDW 13.7 05/10/2012 2329   LYMPHSABS 4.3* 05/10/2012 2028   MONOABS 0.8 05/10/2012 2028   EOSABS 0.1 05/10/2012 2028   BASOSABS 0.1 05/10/2012 2028   Drugs of Abuse  Component Value Date/Time   LABOPIA POSITIVE* 05/10/2012 2349   COCAINSCRNUR NONE DETECTED 05/10/2012 2349   LABBENZ NONE DETECTED 05/10/2012 2349   AMPHETMU NONE DETECTED 05/10/2012 2349   THCU POSITIVE* 05/10/2012 2349   LABBARB NONE DETECTED 05/10/2012 2349    TSH 0.724     Treatments: Flecanide  Discharge Exam: Blood pressure 118/74, pulse 64, temperature 98.2 F (36.8 C), temperature source Oral, resp. rate 18, height 6\' 2"   (1.88 m), weight 90.7 kg (199 lb 15.3 oz), SpO2 100.00%.   Disposition: 01-Home or Self Care  Discharge Orders    Future Orders Please Complete By Expires   Diet - low sodium heart healthy      Increase activity slowly        Medication List  As of 05/12/2012 10:18 AM   STOP taking these medications         sucralfate 1 GM/10ML suspension         TAKE these medications         aspirin 81 MG chewable tablet   Chew 2 tablets (162 mg total) by mouth daily.           Follow-up Information    Follow up with Thurmon Fair, MD. (Our office will call you with the appt date and time.)    Contact information:   [address] [place] [address] [phone]          Signed: Wilburt Finlay 05/12/2012, 10:18 AM

## 2012-05-17 NOTE — ED Provider Notes (Signed)
I saw and evaluated the patient, reviewed the resident's note and I agree with the findings and plan.   .Face to face Exam:  General:  Awake HEENT:  Atraumatic Resp:  Normal effort Abd:  Nondistended Neuro:No focal weakness Lymph: No adenopathy   Nelia Shi, MD 05/17/12 2249

## 2013-04-30 IMAGING — CR DG CHEST 2V
2 series · 2 of 2 positions shown · non-contrast
Comparison: None

CLINICAL DATA: Atrial fibrillation

CHEST - 2 VIEW

[w chest pa]
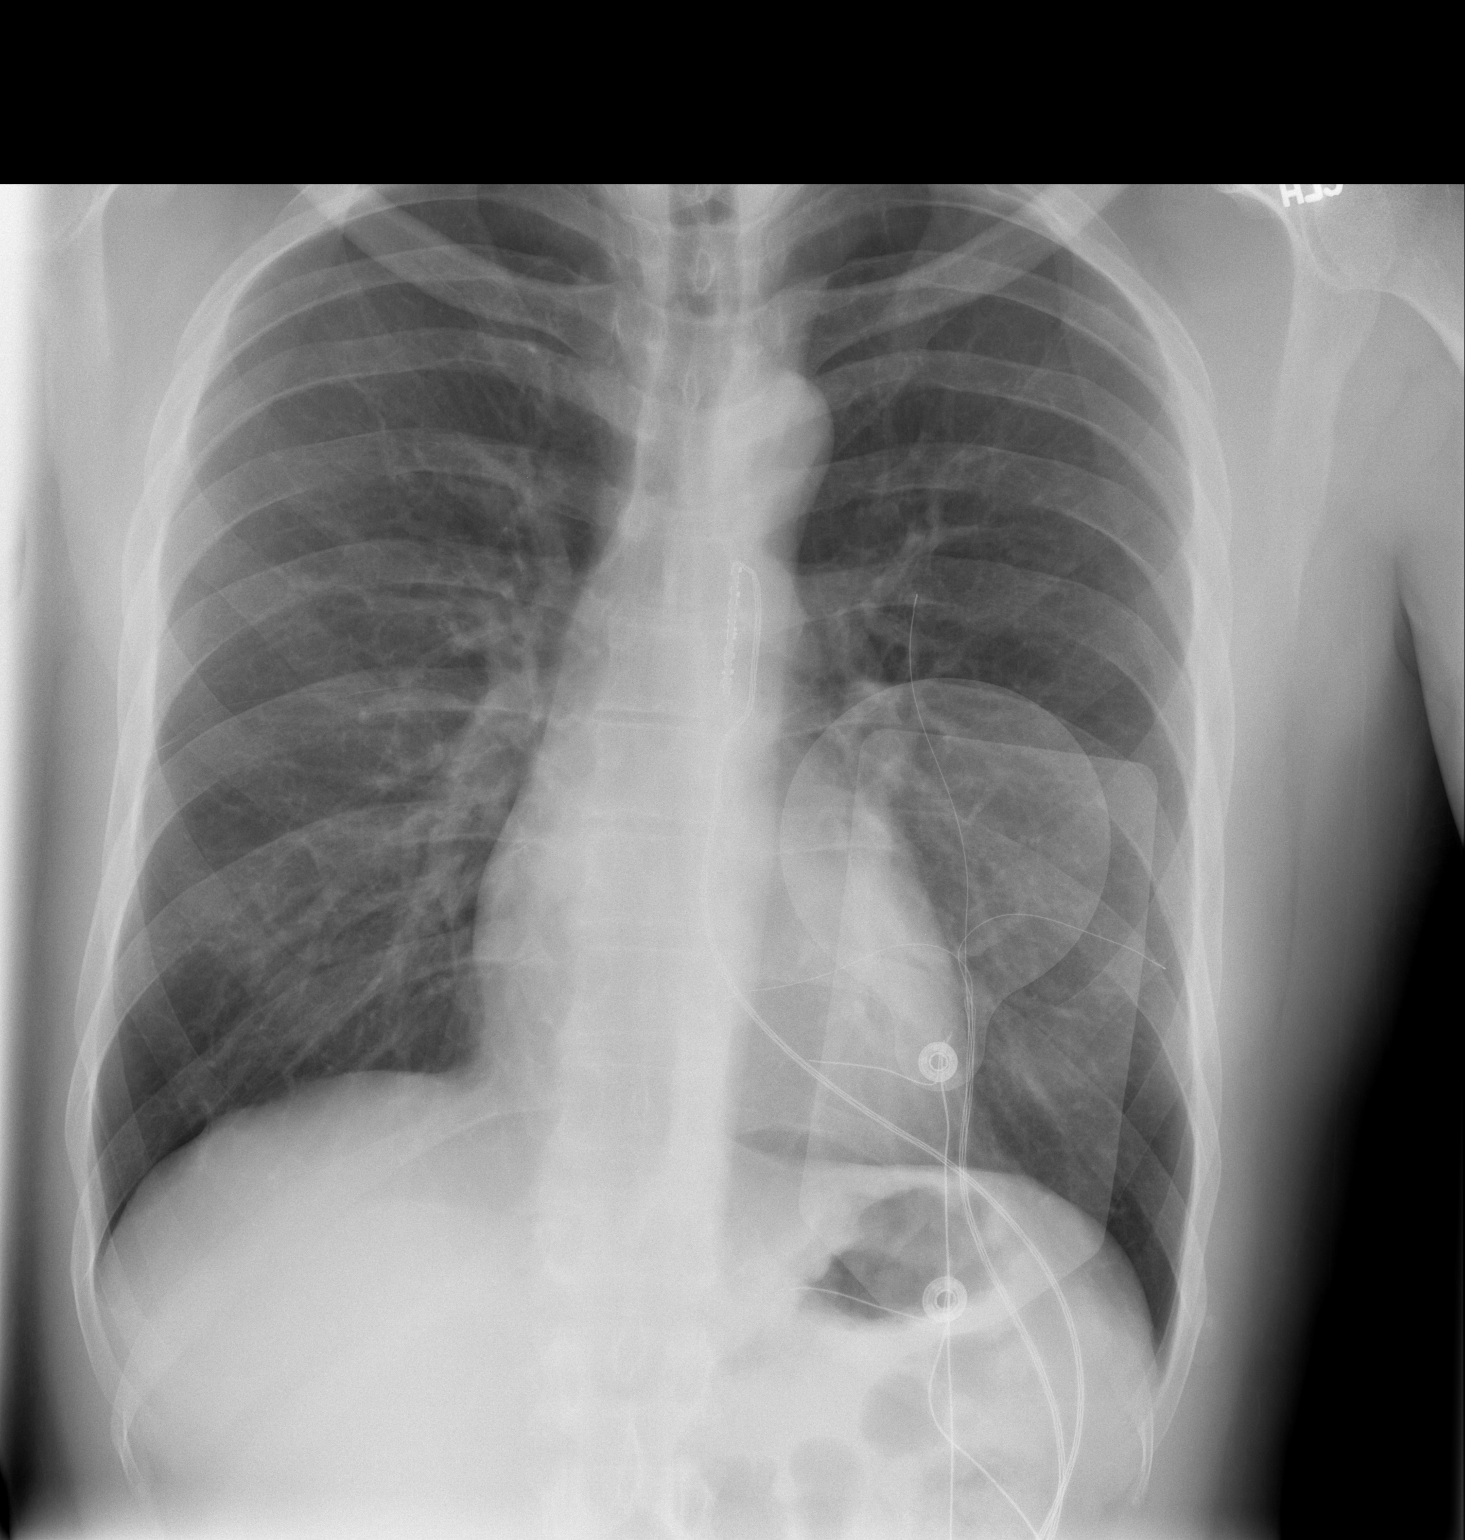

[w chest lat]
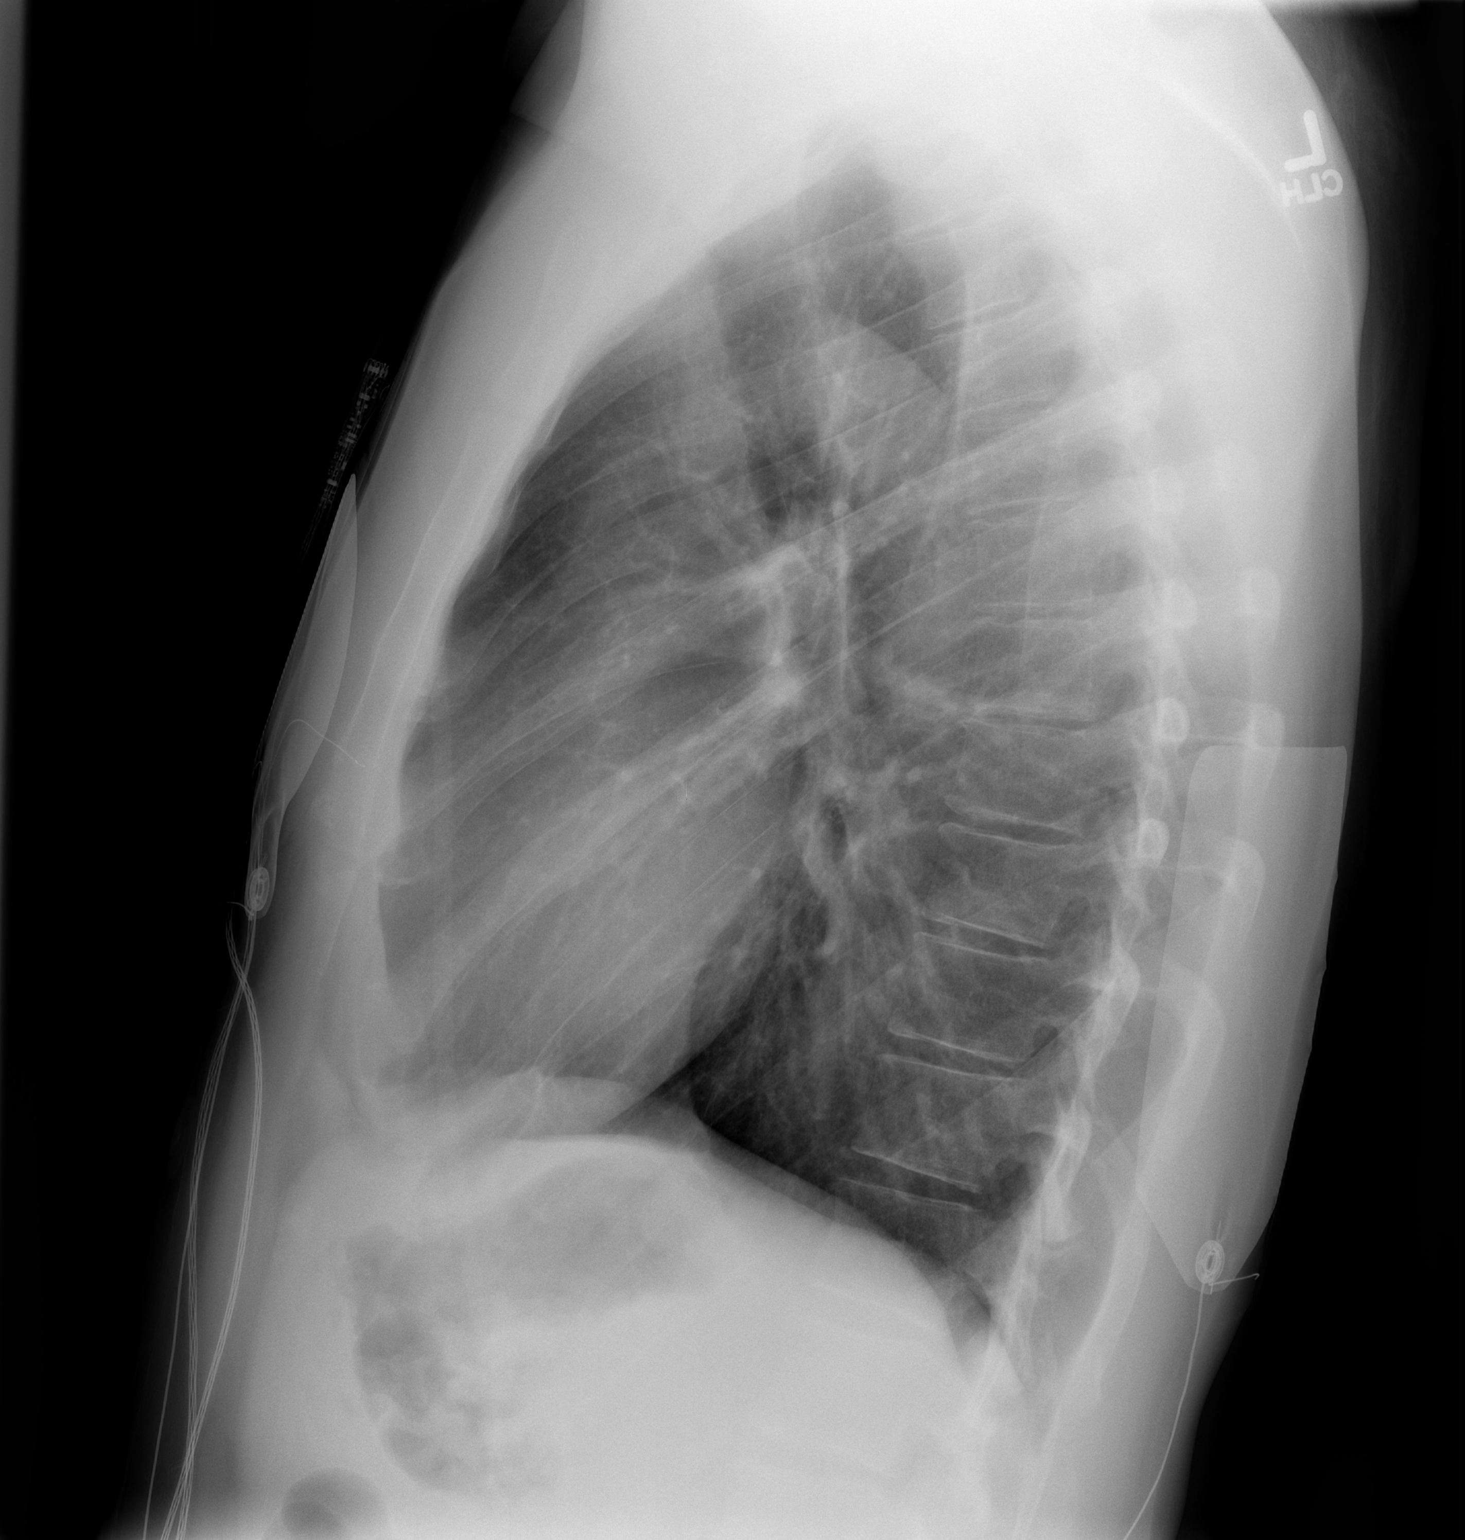

[2 of 2 positions shown; findings below may reference images not displayed]

FINDINGS: The heart size and mediastinal contours are within normal
limits.  Both lungs are clear.  The visualized skeletal structures
are unremarkable.
IMPRESSION: Negative exam.]

## 2013-05-23 ENCOUNTER — Encounter (HOSPITAL_COMMUNITY): Payer: Self-pay | Admitting: Nurse Practitioner

## 2013-05-23 ENCOUNTER — Emergency Department (HOSPITAL_COMMUNITY)
Admission: EM | Admit: 2013-05-23 | Discharge: 2013-05-23 | Discharge status: Absent without leave | Payer: Self-pay | Source: Home / Self Care

## 2013-05-23 ENCOUNTER — Emergency Department (HOSPITAL_COMMUNITY)
Admission: EM | Admit: 2013-05-23 | Discharge: 2013-05-23 | Disposition: A | Discharge status: Home / Self Care | Payer: Self-pay | Attending: Emergency Medicine | Admitting: Emergency Medicine

## 2013-05-23 DIAGNOSIS — K0889 Other specified disorders of teeth and supporting structures: Secondary | ICD-10-CM

## 2013-05-23 DIAGNOSIS — R51 Headache: Secondary | ICD-10-CM | POA: Insufficient documentation

## 2013-05-23 DIAGNOSIS — F17200 Nicotine dependence, unspecified, uncomplicated: Secondary | ICD-10-CM | POA: Insufficient documentation

## 2013-05-23 DIAGNOSIS — K089 Disorder of teeth and supporting structures, unspecified: Secondary | ICD-10-CM | POA: Insufficient documentation

## 2013-05-23 MED ORDER — OXYCODONE-ACETAMINOPHEN 5-325 MG PO TABS
2.0000 | ORAL_TABLET | Freq: Once | ORAL | Status: AC
  Administered 2013-05-23: 2 via ORAL
  Filled 2013-05-23 (×2): qty 2

## 2013-05-23 MED ORDER — PENICILLIN V POTASSIUM 500 MG PO TABS: 500.0000 mg | ORAL_TABLET | Freq: Four times a day (QID) | ORAL | Status: AC

## 2013-05-23 MED ORDER — OXYCODONE-ACETAMINOPHEN 5-325 MG PO TABS: 1.0000 | ORAL_TABLET | Freq: Three times a day (TID) | ORAL | Status: DC | PRN

## 2013-05-23 NOTE — ED Notes (Signed)
Pt c/o L sided toothache onset 3 days ago

## 2013-05-23 NOTE — ED Provider Notes (Signed)
CSN: [id number]     Arrival date & time 05/23/13  6578 History   First MD Initiated Contact with Patient 05/23/13 1003     Chief Complaint  Patient presents with  . Dental Pain   (Consider location/radiation/quality/duration/timing/severity/associated sxs/prior Treatment) The history is provided by the patient. No language interpreter was used.  Jeremy Randall is a 42 year old male presenting to the emergency department with dental pain that has been ongoing for the past 3 days. Patient reported that the discomfort is located to the left upper jaw described as a constant throbbing sensation with radiation to the left temple, reported that the discomfort causes a headache. Patient reports that the pain is worse with cold air, cold and hot fluids. Patient reported that nothing makes the pain better. Patient reported that he has used ibuprofen and Orajel with negative relief. Patient reported that he cracked a tooth a long time ago to the left upper jaw, but is unable to recall exactly when the incident occurred. Patient reported that his last dentist visit was approximately a year and a half ago. Denied fever, chills, chest pain, shortness of breath, difficulty breathing, abdominal pain, nausea, vomiting, diarrhea, neck pain, neck stiffness, ear pain, sore throat, difficulty swallowing, facial swelling, speech difficulty. PCP none  History reviewed. No pertinent past medical history. Past Surgical History  Procedure Laterality Date  . Patellar tendon repair     History reviewed. No pertinent family history. History  Substance Use Topics  . Smoking status: Current Every Day Smoker    Types: Cigarettes  . Smokeless tobacco: Not on file  . Alcohol Use: Yes     Comment: rare    Review of Systems  Constitutional: Negative for fever and chills.  HENT: Positive for dental problem. Negative for sore throat, trouble swallowing, neck pain and neck stiffness.   Respiratory: Negative for shortness of  breath.   Cardiovascular: Negative for chest pain.  Gastrointestinal: Negative for nausea, vomiting and abdominal pain.  Neurological: Positive for headaches.  All other systems reviewed and are negative.    Allergies  Review of patient's allergies indicates no known allergies.  Home Medications   Current Outpatient Rx  Name  Route  Sig  Dispense  Refill  . ibuprofen (ADVIL,MOTRIN) 200 MG tablet   Oral   Take 600 mg by mouth every 6 (six) hours as needed for pain.         Marland Kitchen oxyCODONE-acetaminophen (PERCOCET/ROXICET) 5-325 MG per tablet   Oral   Take 1 tablet by mouth every 8 (eight) hours as needed for pain.   5 tablet   0   . penicillin v potassium (VEETID) 500 MG tablet   Oral   Take 1 tablet (500 mg total) by mouth 4 (four) times daily.   40 tablet   0    BP 132/89  Pulse 59  Temp(Src) 98.5 F (36.9 C) (Oral)  Resp 16  Ht 6\' 2"  (1.88 m)  Wt 210 lb (95.255 kg)  BMI 26.95 kg/m2  SpO2 99% Physical Exam  Nursing note and vitals reviewed. Constitutional: He is oriented to person, place, and time. He appears well-developed and well-nourished. No distress.  HENT:  Head: Normocephalic and atraumatic.  Negative facial swelling Negative pain upon palpation to the frontal and maxillary regions Negative pain upon palpation to the preauricular and postauricular regions  Diagrammed left maxillary second molar identified, Pain upon palpation with tongue depressor. Remaining of the tooth is in decaying process. Negative trismus. Negative signs of peritonsillar  abscess. Negative sublingual lesions. Negative signs of Ludwig's angina. Negative abscess or cyst formation. Negative swelling, erythema, inflammation, lesions, sores, cysts, eye disease noted to the maxillary and mandibular gum line, as well as the buccal mucosa.  Eyes: Conjunctivae and EOM are normal. Pupils are equal, round, and reactive to light. Right eye exhibits no discharge. Left eye exhibits no discharge.   Neck: Normal range of motion. Neck supple.  Negative neck stiffness Negative nuchal rigidity Negative pain upon palpation to cervical spine Negative lymphadenopathy  Cardiovascular: Normal rate, regular rhythm and normal heart sounds.  Exam reveals no friction rub.   No murmur heard. Pulses:      Radial pulses are 2+ on the right side, and 2+ on the left side.  Pulmonary/Chest: Effort normal and breath sounds normal. No respiratory distress. He has no wheezes. He has no rales.  Musculoskeletal: Normal range of motion.  Lymphadenopathy:    He has no cervical adenopathy.  Neurological: He is alert and oriented to person, place, and time. No cranial nerve deficit. He exhibits normal muscle tone. Coordination normal.  Cranial nerves III-XII grossly intact   Skin: Skin is warm and dry. No rash noted. He is not diaphoretic. No erythema.  Psychiatric: He has a normal mood and affect. His behavior is normal. Thought content normal.    ED Course  Procedures (including critical care time) Labs Review Labs Reviewed - No data to display Imaging Review No results found.  Medications  oxyCODONE-acetaminophen (PERCOCET/ROXICET) 5-325 MG per tablet 2 tablet (2 tablets Oral Given 05/23/13 1110)    MDM   1. Pain, dental    Patient presenting to emergency department with dental pain that has been ongoing for the past 3 days. Alert and oriented. Negative facial swelling identified. Diagrammed left maxillary second molar, with pain upon palpation with tongue depressor. Negative active drainage of pus, negative active bleeding. Negative signs of Ludwig's angina. Negative trismus. Negative peritonsillar abscess. Cranial nerves grossly intact. Negative meningeal signs. Patient stable, afebrile. Pain controlled in ED setting. Diagrammed tooth will most likely need to be extracted, discussed this with patient for tooth relief. Patient discharged. Discharge patient with antibiotics for infection coverage  and pain medication, small dose. Discussed with patient the course, precautions, disposal of pain medications. Referred patient to dentist. Free dental services sheet was given and discharge papers. Discussed with patient to rest and stay hydrated. Discussed with patient to continue to monitor symptoms if symptoms are to worsen or change to report back to emergency department-strict return instructions given. Patient reported plan of care, understood, all questions answered.      Raymon Mutton, [name] 05/24/13 1450

## 2013-05-24 NOTE — ED Provider Notes (Signed)
Medical screening examination/treatment/procedure(s) were performed by non-physician practitioner and as supervising physician I was immediately available for consultation/collaboration.  Kellianne Ek, MD 05/24/13 1525 

## 2014-04-27 ENCOUNTER — Emergency Department (HOSPITAL_COMMUNITY)
Admission: EM | Admit: 2014-04-27 | Discharge: 2014-04-27 | Disposition: A | Discharge status: Home / Self Care | Payer: Self-pay | Attending: Emergency Medicine | Admitting: Emergency Medicine

## 2014-04-27 ENCOUNTER — Encounter (HOSPITAL_COMMUNITY): Payer: Self-pay | Admitting: Emergency Medicine

## 2014-04-27 DIAGNOSIS — K0889 Other specified disorders of teeth and supporting structures: Secondary | ICD-10-CM

## 2014-04-27 DIAGNOSIS — F17200 Nicotine dependence, unspecified, uncomplicated: Secondary | ICD-10-CM | POA: Insufficient documentation

## 2014-04-27 DIAGNOSIS — K089 Disorder of teeth and supporting structures, unspecified: Secondary | ICD-10-CM | POA: Insufficient documentation

## 2014-04-27 MED ORDER — HYDROCODONE-ACETAMINOPHEN 5-325 MG PO TABS: 1.0000 | ORAL_TABLET | Freq: Four times a day (QID) | ORAL | Status: DC | PRN

## 2014-04-27 MED ORDER — PENICILLIN V POTASSIUM 500 MG PO TABS: 500.0000 mg | ORAL_TABLET | Freq: Four times a day (QID) | ORAL | Status: DC

## 2014-04-27 MED ORDER — IBUPROFEN 800 MG PO TABS: 800.0000 mg | ORAL_TABLET | Freq: Three times a day (TID) | ORAL | Status: DC | PRN

## 2014-04-27 NOTE — ED Provider Notes (Signed)
CSN: [id number]     Arrival date & time 04/27/14  2122 History   None    This chart was scribed for non-physician practitioner, Ebbie Ridgehris Kristion Holifield [name] working with Jeremy MochaBlair Walden, MD by Arlan OrganAshley Leger, ED Scribe. This patient was seen in room WTR7/WTR7 and the patient's care was started at 9:52 PM.   Chief Complaint  Patient presents with  . Dental Pain   The history is provided by the patient. No language interpreter was used.    HPI Comments: Jeremy Randall is a 43 y.o. male who presents to the Emergency Department complaining of constant, moderate R upper dental pain x 2 days that has progressively worsened. Pain is exacerbated with cold/hot temperatures and palpation. No alleviating factors at this time. He denies any fever or chills. Pt plans to follow up with a dentist on 8/27. No known allergies to medications. No other concerns this visit.  History reviewed. No pertinent past medical history. Past Surgical History  Procedure Laterality Date  . Patellar tendon repair    . Appendectomy     Family History  Problem Relation Age of Onset  . Diabetes Mother   . Cancer Sister    History  Substance Use Topics  . Smoking status: Current Every Day Smoker    Types: Cigarettes  . Smokeless tobacco: Not on file  . Alcohol Use: Yes     Comment: rare    Review of Systems  Constitutional: Negative for fever and chills.  HENT: Positive for dental problem.       Allergies  Review of patient's allergies indicates no known allergies.  Home Medications   Prior to Admission medications   Medication Sig Start Date End Date Taking? Authorizing Provider  ibuprofen (ADVIL,MOTRIN) 200 MG tablet Take 600 mg by mouth every 6 (six) hours as needed for pain.    Historical Provider, MD  oxyCODONE-acetaminophen (PERCOCET/ROXICET) 5-325 MG per tablet Take 1 tablet by mouth every 8 (eight) hours as needed for pain. 05/23/13   Marissa Sciacca, [name]   Triage Vitals: BP 119/78  Pulse 56  Temp(Src) 98.5 F  (36.9 C) (Oral)  Resp 16  Wt 204 lb (92.534 kg)  SpO2 98%   Physical Exam  Nursing note and vitals reviewed. Constitutional: He is oriented to person, place, and time. He appears well-developed and well-nourished.  HENT:  Head: Normocephalic.  Eyes: EOM are normal.  Neck: Normal range of motion.  Pulmonary/Chest: Effort normal.  Abdominal: He exhibits no distension.  Musculoskeletal: Normal range of motion.  Neurological: He is alert and oriented to person, place, and time.  Psychiatric: He has a normal mood and affect.    ED Course  Procedures (including critical care time)  DIAGNOSTIC STUDIES: Oxygen Saturation is 98% on RA, Normal by my interpretation.    COORDINATION OF CARE: 9:51 PM- Will prescribe pain medication and antibiotics at discharge. Discussed treatment plan with pt at bedside and pt agreed to plan.       I personally performed the services described in this documentation, which was scribed in my presence. The recorded information has been reviewed and is accurate.    Carlyle Dollyhristopher W Bailee Thall, [name] 04/27/14 2217

## 2014-04-27 NOTE — ED Notes (Signed)
Pt is c/o toothache on the top right  Pt states sxs started 2 days ago

## 2014-04-27 NOTE — ED Notes (Signed)
Patient is alert and oriented x3.  He was given DC instructions and follow up visit instructions.  Patient gave verbal understanding.  He was DC ambulatory under his own power to home.  V/S stable.  He was not showing any signs of distress on DC 

## 2014-04-27 NOTE — Discharge Instructions (Signed)
Return here as needed. Follow up with your dentist.  °

## 2014-04-28 NOTE — ED Provider Notes (Signed)
Medical screening examination/treatment/procedure(s) were performed by non-physician practitioner and as supervising physician I was immediately available for consultation/collaboration.   EKG Interpretation None        Elwin MochaBlair Jaylynn Siefert, MD 04/28/14 0004

## 2014-05-16 ENCOUNTER — Emergency Department (HOSPITAL_COMMUNITY)
Admission: EM | Admit: 2014-05-16 | Discharge: 2014-05-17 | Disposition: A | Discharge status: Home / Self Care | Payer: Self-pay | Attending: Emergency Medicine | Admitting: Emergency Medicine

## 2014-05-16 ENCOUNTER — Encounter (HOSPITAL_COMMUNITY): Payer: Self-pay | Admitting: Emergency Medicine

## 2014-05-16 DIAGNOSIS — K089 Disorder of teeth and supporting structures, unspecified: Secondary | ICD-10-CM | POA: Insufficient documentation

## 2014-05-16 DIAGNOSIS — Z79899 Other long term (current) drug therapy: Secondary | ICD-10-CM | POA: Insufficient documentation

## 2014-05-16 DIAGNOSIS — K047 Periapical abscess without sinus: Secondary | ICD-10-CM | POA: Insufficient documentation

## 2014-05-16 DIAGNOSIS — F17200 Nicotine dependence, unspecified, uncomplicated: Secondary | ICD-10-CM | POA: Insufficient documentation

## 2014-05-16 DIAGNOSIS — K2960 Other gastritis without bleeding: Secondary | ICD-10-CM | POA: Insufficient documentation

## 2014-05-16 DIAGNOSIS — K029 Dental caries, unspecified: Secondary | ICD-10-CM | POA: Insufficient documentation

## 2014-05-16 DIAGNOSIS — T39395A Adverse effect of other nonsteroidal anti-inflammatory drugs [NSAID], initial encounter: Secondary | ICD-10-CM

## 2014-05-16 DIAGNOSIS — Z792 Long term (current) use of antibiotics: Secondary | ICD-10-CM | POA: Insufficient documentation

## 2014-05-16 DIAGNOSIS — T398X5A Adverse effect of other nonopioid analgesics and antipyretics, not elsewhere classified, initial encounter: Secondary | ICD-10-CM | POA: Insufficient documentation

## 2014-05-16 DIAGNOSIS — T3995XA Adverse effect of unspecified nonopioid analgesic, antipyretic and antirheumatic, initial encounter: Secondary | ICD-10-CM

## 2014-05-16 MED ORDER — OXYCODONE-ACETAMINOPHEN 5-325 MG PO TABS
2.0000 | ORAL_TABLET | Freq: Once | ORAL | Status: AC
  Administered 2014-05-16: 2 via ORAL
  Filled 2014-05-16: qty 2

## 2014-05-16 MED ORDER — PANTOPRAZOLE SODIUM 40 MG PO TBEC
40.0000 mg | DELAYED_RELEASE_TABLET | Freq: Once | ORAL | Status: AC
  Administered 2014-05-16: 40 mg via ORAL
  Filled 2014-05-16: qty 1

## 2014-05-16 NOTE — ED Notes (Signed)
Pt arrived to the to the ED with a complaint of dental pain.  Pt seen previously with same condition.  Pt has run the courseo f his antibiotics but has been unable to see the dentist.  Pt is also complaining of abdominal pain that the Pt states feels" like gas."  Pt has had flatus.  Pt states abdominal pain has been a recent development

## 2014-05-16 NOTE — ED Notes (Signed)
Pt report pain in RU gum that has been going on for the past 4 days and has abcess in gum.  Given pain medication.  Pt alert and cooperative.  NAD.  Pt reports pain in mouth is 7/10.  Abd pain sts thinks its gas like pain that is sharp and moving around in the bottom of his stomach.

## 2014-05-16 NOTE — ED Notes (Signed)
Initial Contact - pt A+Ox4, reports c/o dental pain and abd gas pain. Pt reports seen here previously for dental pain, has taken full course of abx but unable to get into dentist to follow up.  Pt denies other abd complaints.  Abd s/nt/nd.  Skin PWD.  Speaking full/clear sentences.  NAD.

## 2014-05-16 NOTE — ED Provider Notes (Signed)
CSN: [id number]     Arrival date & time 05/16/14  2121 History   First MD Initiated Contact with Patient 05/16/14 2233     Chief Complaint  Patient presents with  . Dental Pain  . Abdominal Pain     (Consider location/radiation/quality/duration/timing/severity/associated sxs/prior Treatment) The history is provided by the patient and medical records. No language interpreter was used.    Jeremy Randall is a 43 y.o. male  with no major medical problemd presents to the Emergency Department complaining of gradual, persistent, progressively worsening dental pain onset 4 days.  Pain in the right upper teeth and he has associated swelling of the gum.  Pt was seen for the same on 04/27/14 for the same tooth and was given PCN but the pain did not improve.  Pt reports associated "heaviness" in his stomach with intermittent "sharp pains" in his stomach when he takes a deep breath.  Pt reports he has been taking a lot of ibuprofen for his mouth in the last few weeks.  Pt reports eating makes the pain some better and the pain is worse when his stomach is totally empty.  He also reports increased flatulance, but no constipation, diarrhea, melena or hematochezia.  He also denies fever, chills, neck pain, neck stiffness, emesis, weakness, dizziness, syncope.     History reviewed. No pertinent past medical history. Past Surgical History  Procedure Laterality Date  . Patellar tendon repair    . Appendectomy     Family History  Problem Relation Age of Onset  . Diabetes Mother   . Cancer Sister    History  Substance Use Topics  . Smoking status: Current Every Day Smoker    Types: Cigarettes  . Smokeless tobacco: Not on file  . Alcohol Use: Yes     Comment: rare    Review of Systems  Constitutional: Negative for fever, chills and appetite change.  HENT: Positive for dental problem (pain and swelling of the gum). Negative for drooling, ear pain, facial swelling, nosebleeds, postnasal drip, rhinorrhea and  trouble swallowing.   Eyes: Negative for pain and redness.  Respiratory: Negative for cough and wheezing.   Cardiovascular: Negative for chest pain.  Gastrointestinal: Negative for nausea, vomiting and abdominal pain.  Musculoskeletal: Negative for neck pain and neck stiffness.  Skin: Negative for color change and rash.  Neurological: Negative for weakness, light-headedness and headaches.  All other systems reviewed and are negative.     Allergies  Review of patient's allergies indicates no known allergies.  Home Medications   Prior to Admission medications   Medication Sig Start Date End Date Taking? Authorizing Provider  ibuprofen (ADVIL,MOTRIN) 200 MG tablet Take 600 mg by mouth every 6 (six) hours as needed for pain.   Yes Historical Provider, MD  clindamycin (CLEOCIN) 150 MG capsule Take 3 capsules (450 mg total) by mouth 3 (three) times daily. 05/17/14   Samyra Limb, [name]  omeprazole (PRILOSEC) 20 MG capsule Take 1 capsule (20 mg total) by mouth daily. 05/17/14   Ashleyanne Hemmingway, [name]  oxyCODONE-acetaminophen (PERCOCET/ROXICET) 5-325 MG per tablet Take 1-2 tablets by mouth every 4 (four) hours as needed for moderate pain or severe pain. 05/17/14   Amel Kitch, [name]   BP 120/72  Pulse 60  Temp(Src) 98.3 F (36.8 C) (Oral)  Resp 18  SpO2 100% Physical Exam  Nursing note and vitals reviewed. Constitutional: He is oriented to person, place, and time. He appears well-developed and well-nourished. No distress.  Awake, alert, nontoxic appearance  HENT:  Head: Normocephalic and atraumatic.  Right Ear: Tympanic membrane, external ear and ear canal normal.  Left Ear: Tympanic membrane, external ear and ear canal normal.  Nose: Nose normal. Right sinus exhibits no maxillary sinus tenderness and no frontal sinus tenderness. Left sinus exhibits no maxillary sinus tenderness and no frontal sinus tenderness.  Mouth/Throat: Uvula is midline, oropharynx is clear and moist  and mucous membranes are normal. No oral lesions. Abnormal dentition. Dental abscesses and dental caries present. No uvula swelling or lacerations. No oropharyngeal exudate, posterior oropharyngeal edema, posterior oropharyngeal erythema or tonsillar abscesses.    Dental abscess on the gumline between tooth #3 and tooth #4; visible draining; palpable area of fluctuance; no induration to the surrounding tissues Multiple dental caries throughout  Eyes: Conjunctivae are normal. Pupils are equal, round, and reactive to light. Right eye exhibits no discharge. Left eye exhibits no discharge. No scleral icterus.  Neck: Normal range of motion. Neck supple.  Cardiovascular: Normal rate, regular rhythm, normal heart sounds and intact distal pulses.   No murmur heard. Pulmonary/Chest: Effort normal and breath sounds normal. No respiratory distress. He has no wheezes.  Equal chest expansion  Abdominal: Soft. Bowel sounds are normal. He exhibits no distension and no mass. There is no tenderness. There is no rebound and no guarding.  Abdomen soft nontender  Musculoskeletal: Normal range of motion. He exhibits no edema.  Lymphadenopathy:    He has no cervical adenopathy.  Neurological: He is alert and oriented to person, place, and time. He exhibits normal muscle tone. Coordination normal.  Speech is clear and goal oriented Moves extremities without ataxia  Skin: Skin is warm and dry. No rash noted. He is not diaphoretic. No erythema.  Psychiatric: He has a normal mood and affect.    ED Course  INCISION AND DRAINAGE Date/Time: 05/17/2014 12:21 AM Performed by: Dierdre Forth Authorized by: Dierdre Forth Consent: Verbal consent obtained. Risks and benefits: risks, benefits and alternatives were discussed Consent given by: patient Patient understanding: patient states understanding of the procedure being performed Patient consent: the patient's understanding of the procedure matches consent  given Procedure consent: procedure consent matches procedure scheduled Relevant documents: relevant documents present and verified Site marked: the operative site was marked Required items: required blood products, implants, devices, and special equipment available Patient identity confirmed: verbally with patient and arm band Time out: Immediately prior to procedure a "time out" was called to verify the correct patient, procedure, equipment, support staff and site/side marked as required. Type: abscess Body area: mouth (gumline) Patient sedated: no Scalpel size: 11 Incision type: single straight Complexity: simple Drainage: purulent Drainage amount: moderate Wound treatment: wound left open Patient tolerance: Patient tolerated the procedure well with no immediate complications.   (including critical care time) Labs Review Labs Reviewed - No data to display  Imaging Review No results found.   EKG Interpretation None      MDM   Final diagnoses:  Dental abscess  Gastritis due to nonsteroidal anti-inflammatory drug (NSAID)    Jeremy Randall presents with dental pain and gross abscess on exam.  Exam unconcerning for Ludwig's angina or spread of infection.  Patient afebrile without tachycardia. Will treat with clindamycin and pain medicine.  Patient also with complaints of stomach discomfort without nausea or vomiting, better with food ingestion and worse on his stomach for large amounts of NSAID use.   Abdomen soft and nontender on exam. Patient well appearing and tolerating by mouth in the emergency department without difficulty.  Patient likely with gastritis secondary to NSAID use. Patient given Protonix here in the emergency department with improvement in discomfort.  Urged patient to follow-up with dentist and primary care provider within 3 days. Patient is to return to the emergency department for worsening abdominal pain, nausea, high fevers swelling of his face or other  concerning symptoms.  BP 120/72  Pulse 60  Temp(Src) 98.3 F (36.8 C) (Oral)  Resp 18  SpO2 100% .      Dahlia Client Nerea Bordenave, [name] 05/17/14 [phone]

## 2014-05-17 MED ORDER — OXYCODONE-ACETAMINOPHEN 5-325 MG PO TABS: 1.0000 | ORAL_TABLET | ORAL | Status: DC | PRN

## 2014-05-17 MED ORDER — OMEPRAZOLE 20 MG PO CPDR: 20.0000 mg | DELAYED_RELEASE_CAPSULE | Freq: Every day | ORAL | Status: DC

## 2014-05-17 MED ORDER — CLINDAMYCIN HCL 150 MG PO CAPS: 450.0000 mg | ORAL_CAPSULE | Freq: Three times a day (TID) | ORAL | Status: DC

## 2014-05-17 NOTE — Discharge Instructions (Signed)
1. Medications: percocet, clindamycin, omeprazole, usual home medications 2. Treatment: rest, drink plenty of fluids, take medications as prescribed, avoid NSAIDs 3. Follow Up: Please followup with Dr. Lawrence Marseilles for discussion of your diagnoses and further evaluation after today's visit; if you do not have a primary care doctor use the resource guide provided to find one; f/u with dentistry as discussed   Abscessed Tooth An abscessed tooth is an infection around your tooth. It may be caused by holes or damage to the tooth (cavity) or a dental disease. An abscessed tooth causes mild to very bad pain in and around the tooth. See your dentist right away if you have tooth or gum pain. HOME CARE  Take your medicine as told. Finish it even if you start to feel better.  Do not drive after taking pain medicine.  Rinse your mouth (gargle) often with salt water ( teaspoon salt in 8 ounces of warm water).  Do not apply heat to the outside of your face. GET HELP RIGHT AWAY IF:   You have a temperature by mouth above 102 F (38.9 C), not controlled by medicine.  You have chills and a very bad headache.  You have problems breathing or swallowing.  Your mouth will not open.  You develop puffiness (swelling) on the neck or around the eye.  Your pain is not helped by medicine.  Your pain is getting worse instead of better. MAKE SURE YOU:   Understand these instructions.  Will watch your condition.  Will get help right away if you are not doing well or get worse. Document Released: 02/13/2008 Document Revised: 11/19/2011 Document Reviewed: 12/05/2010 [facility] Patient Information 2015 [place], [place]. This information is not intended to replace advice given to you by your health care provider. Make sure you discuss any questions you have with your health care provider.    Emergency Department Resource Guide 1) Find a Doctor and Pay Out of Pocket Although you won't have to find out who is  covered by your insurance plan, it is a good idea to ask around and get recommendations. You will then need to call the office and see if the doctor you have chosen will accept you as a new patient and what types of options they offer for patients who are self-pay. Some doctors offer discounts or will set up payment plans for their patients who do not have insurance, but you will need to ask so you aren't surprised when you get to your appointment.  2) Contact Your Local Health Department Not all health departments have doctors that can see patients for sick visits, but many do, so it is worth a call to see if yours does. If you don't know where your local health department is, you can check in your phone book. The CDC also has a tool to help you locate your state's health department, and many state websites also have listings of all of their local health departments.  3) Find a Walk-in Clinic If your illness is not likely to be very severe or complicated, you may want to try a walk in clinic. These are popping up all over the country in pharmacies, drugstores, and shopping centers. They're usually staffed by nurse practitioners or physician assistants that have been trained to treat common illnesses and complaints. They're usually fairly quick and inexpensive. However, if you have serious medical issues or chronic medical problems, these are probably not your best option.  No Primary Care Doctor: - Call Health Connect at  [phone] - they can help you locate a primary care doctor that  accepts your insurance, provides certain services, etc. - Physician Referral Service- [phone]  Chronic Pain Problems: Organization         Address  Phone   Notes  [facility]  [phone] Patients need to be referred by their primary care doctor.   Medication Assistance: Organization         Address  Phone   Notes  [facility] Medication [facility] [address] [place] [place], [place] [address] [phone] --Must be a resident of [facility] -- Must have NO insurance coverage whatsoever (no Medicaid/ Medicare, etc.) -- The pt. MUST have a primary care doctor that directs their care regularly and follows them in the community   [facility]  [phone]   [employer]  [phone]    Agencies that provide inexpensive medical care: Organization         Address  Phone   Notes  [facility]  [phone]   [facility]    [phone]   [facility] [address] [place], [place] [address] [phone]   [facility] [address] [place], [place] [phone]   Planned Parenthood    [phone]   [facility]    [phone]   Community [facility] and [facility]  [address], [place] Phone:  [phone], Fax:  [phone] Hours of Operation:  9 am - 6 pm, M-F.  Also accepts Medicaid/Medicare and self-pay.  [facility]  [address], [place] Phone: [phone], Fax: [phone]. Hours of Operation:  8:30 am - 5:30 pm, M-F.  Also accepts Medicaid and self-pay.  [facility] [place] [address], [place] Phone: [phone]   [facility] [address] Natasha Bence [place], [place] [phone], Ext. [phone] Mondays & Thursdays: 7-9 AM.  First 15 patients are seen on a first come, first serve basis.    Medicaid-accepting [facility] Providers:  Organization         Address  Phone   Notes  [facility] [address], [place] [phone] Also accepts self-pay patients.  [facility] [address] Laurell Josephs [place], [place]  [phone]   [facility] [address], [place] [phone]   [facility] [address] [place] [phone]   Renaye Rakers [address], [place]   [phone] Only accepts [place] Access [place] patients after they have their name applied to their card.   Self-Pay (no insurance) in [facility]:  Organization         Address  Phone   Notes  Sickle Cell Patients, [facility] Internal Medicine [address] [place], [place] [phone]   [facility] [address] [place], [place] [phone]   [facility] [place]  [address],  [phone]   [facility]/Dr. Osei-Bonsu  [address] [place] or [address], [place] [phone] Phone number for both [place] and [place] locations is the same.  [facility]gent [facility] and [facility] [address], [place] [phone]   [facility] [address] [place]  or [address] [phone]   [facility] [address] [place] [phone], phone; [phone], fax Sees patients 1st and 3rd Saturday of every month.  Must not qualify for public or private insurance (i.e. Medicaid, Medicare, [place] Health Choice, Veterans' Benefits)  Household income should be no more than 200% of the poverty level The clinic cannot treat you if you are pregnant or think you are pregnant  Sexually transmitted diseases are not treated at the clinic.    Dental Care: Organization         Address  Phone  Notes  [facility] Department of [facility] [address] [place], [place] [phone] Accepts children up to age 25 who are enrolled in [place] or [place] Health Choice; pregnant women with a Medicaid card; and children who have applied for Medicaid or [place] Health Choice, but were declined, whose parents can pay a reduced fee at time of service.  [facility]  [address], [place] [phone] Accepts children up to age 44 who are enrolled  in [place] or [place] Health Choice; pregnant women with a Medicaid card; and children who have applied for Medicaid or [place] Health Choice, but were declined, whose parents can pay a reduced fee at time of service.  [facility]  [address] [place], [place] [phone] Patients are seen by appointment only. Walk-ins are not accepted. [facility] will see patients 1 years of age and older. Monday - Tuesday (8am-5pm) Most Wednesdays (8:30-5pm) $30 per visit, cash only  [facility]  [address], [facility] [phone] Patients are seen by appointment only. Walk-ins are not accepted. [facility] will see patients 25 years of age and older. One Wednesday Evening (Monthly: Volunteer Based).  $30 per visit, cash only  [employer]  [phone] for adults; Children under age 40, call Graduate Pediatric Dentistry at [phone]. Children aged 40-14, please call [phone] to request a pediatric application.  Dental services are provided in all areas of dental care including fillings, crowns and bridges, complete and partial dentures, implants, gum treatment, root canals, and extractions. Preventive care is also provided. Treatment is provided to both adults and children. Patients are selected via a lottery and there is often a waiting list.   [facility] [address] [place]  [phone] [email or web address]   [facility] [address] [place], [place] [phone], Ext. [phone] Second and Fourth Thursday of each month, opens at 6:30 AM; Clinic ends at 9 AM.  Patients are seen on a first-come first-served basis, and a limited number are seen during each clinic.   [facility]  [address] Ether Griffins [place], [place] [phone]   Eligibility Requirements You must have lived in [place], [place], or [place] counties for at least the last three months.   You cannot be  eligible for state or federal sponsored [place], including [employer], [place], or [employer].   You generally cannot be eligible for healthcare insurance through your employer.    How to apply: Eligibility screenings are held every Tuesday and Wednesday afternoon from 1:00 pm until 4:00 pm. You do not need an appointment for the interview!  [facility] [address] [place], [place] [phone]   [facility] Department  [phone]  [facility] Department  [phone]   [facility] Department  [phone]    Behavioral Health Resources in the Community: Intensive Outpatient Programs Organization         Address  Phone  Notes  [facility] Services [address] [place], [place] [phone]   [facility] Outpatient [address], [place], [place] [phone]   [facility] [address] [place], [place]  [phone]   [facility] Mental Health [address],  [place], [place] [phone] or [phone]   Substance Abuse Resources Organization         Address  Phone  Notes  [facility]  [phone]   [facility]  [phone]   [facility] [place]  [phone]   Floydene Flock  [phone]   Residential & Outpatient Substance Abuse Program  [phone]   Psychological Services Organization         Address  Phone  Notes  [facility] Behavioral Health  [phone]   [facility]  [phone]   [facility] Mental Health [address], [place] [phone] or [phone]    Mobile Crisis Teams Organization         Address  Phone  Notes  [facility], Mobile Crisis Care Unit  [phone]   [facility]  [address] [place], [place] [phone]   Doristine Locks [address] [place] [phone]    Self-Help/Support Groups Organization          Address  Phone             Notes  [facility] [place] - variety of support groups  336- I7437963 Call for more information  Narcotics Anonymous (NA), [facility] [address], [employer] [place]  2 meetings at this location   Statistician         Address  Phone  Notes  [facility] [address],    [place]  [phone]   [facility]  [address] [place] [address], [place], [place] [phone]   [facility] [address], [place] [phone] Admissions: 8am-3pm M-F  [facility] [address],    [place], [place] [phone]   [facility] [address] [place], [place], [place] [phone]   [facility] [address]  [place], [place] [phone]   [facility] - Intensive Outpatient [address] Laurell Josephs [address], [place], [place] [phone]   [facility] ([facility] [address] [place]  [place], [place] [phone] or [phone]   [facility] ([facility]) [address] [place], [place] [phone] Accepts Medicaid  [facility] [address],  [place] [phone] Substance Abuse/Addiction Treatment   [facility] Organization         Address  Phone  Notes  [facility]  [phone]   Angie Fava, PhD [address] Ervin Knack [place], [place]   [phone] or [phone]   [facility] Behavioral   [address] [place], [place] [phone]   [facility] [address], [place], [place] [phone] Insurance/Medicaid/sponsorship through [employer] and Families [address]  [place], [place] [phone] Therapy/tele-psych/case  °[facility] [address]  ° [place], [place] [phone]    °Dr. Arfeen  [phone]   °[facility] [place]  [employer]  [place] [facility] 1) [address], [place] °2) [address], [place] °3)  [address], [place] [phone] °([phone] ° °([phone]) [phone]   °[place] Child Abuse Hotline (336) 342-1394 or ([phone] (After Hours)    ° ° ° ° °

## 2014-05-17 NOTE — ED Notes (Signed)
Family/friend driving pt home.

## 2014-05-17 NOTE — ED Provider Notes (Signed)
History/physical exam/procedure(s) were performed by non-physician practitioner and as supervising physician I was immediately available for consultation/collaboration. I have reviewed all notes and am in agreement with care and plan.   Bern Fare S Hilario Quarry07/15 [phone]

## 2014-05-26 ENCOUNTER — Encounter: Payer: Self-pay | Admitting: Internal Medicine

## 2014-05-26 ENCOUNTER — Ambulatory Visit: Payer: Self-pay | Attending: Internal Medicine | Admitting: Internal Medicine

## 2014-05-26 VITALS — BP 132/85 | HR 58 | Temp 98.2°F | Resp 17 | Ht 72.0 in | Wt 198.2 lb

## 2014-05-26 DIAGNOSIS — Z23 Encounter for immunization: Secondary | ICD-10-CM | POA: Insufficient documentation

## 2014-05-26 DIAGNOSIS — Z0000 Encounter for general adult medical examination without abnormal findings: Secondary | ICD-10-CM

## 2014-05-26 DIAGNOSIS — Z8719 Personal history of other diseases of the digestive system: Secondary | ICD-10-CM | POA: Insufficient documentation

## 2014-05-26 DIAGNOSIS — Z87898 Personal history of other specified conditions: Secondary | ICD-10-CM | POA: Insufficient documentation

## 2014-05-26 DIAGNOSIS — Z139 Encounter for screening, unspecified: Secondary | ICD-10-CM

## 2014-05-26 DIAGNOSIS — K089 Disorder of teeth and supporting structures, unspecified: Secondary | ICD-10-CM | POA: Insufficient documentation

## 2014-05-26 DIAGNOSIS — K2990 Gastroduodenitis, unspecified, without bleeding: Secondary | ICD-10-CM

## 2014-05-26 DIAGNOSIS — F17200 Nicotine dependence, unspecified, uncomplicated: Secondary | ICD-10-CM | POA: Insufficient documentation

## 2014-05-26 DIAGNOSIS — Z79899 Other long term (current) drug therapy: Secondary | ICD-10-CM | POA: Insufficient documentation

## 2014-05-26 DIAGNOSIS — K2970 Gastritis, unspecified, without bleeding: Secondary | ICD-10-CM | POA: Insufficient documentation

## 2014-05-26 DIAGNOSIS — K0889 Other specified disorders of teeth and supporting structures: Secondary | ICD-10-CM | POA: Insufficient documentation

## 2014-05-26 LAB — LIPID PANEL
CHOLESTEROL: 178 mg/dL (ref 0–200)
HDL: 46 mg/dL (ref >39)
LDL CALC: 119 mg/dL — AB (ref 0–99)
Total CHOL/HDL Ratio: 3.9 Ratio
Triglycerides: 63 mg/dL (ref <150)
VLDL: 13 mg/dL (ref 0–40)

## 2014-05-26 LAB — CBC WITH DIFFERENTIAL/PLATELET
BASOS ABS: 0.1 10*3/uL (ref 0.0–0.1)
Basophils Relative: 1 % (ref 0–1)
EOS ABS: 0.1 10*3/uL (ref 0.0–0.7)
EOS PCT: 2 % (ref 0–5)
HEMATOCRIT: 42.8 % (ref 39.0–52.0)
Hemoglobin: 14.8 g/dL (ref 13.0–17.0)
LYMPHS ABS: 2.3 10*3/uL (ref 0.7–4.0)
Lymphocytes Relative: 37 % (ref 12–46)
MCH: 30.5 pg (ref 26.0–34.0)
MCHC: 34.6 g/dL (ref 30.0–36.0)
MCV: 88.1 fL (ref 78.0–100.0)
MONOS PCT: 14 % — AB (ref 3–12)
Monocytes Absolute: 0.9 10*3/uL (ref 0.1–1.0)
NEUTROS PCT: 46 % (ref 43–77)
Neutro Abs: 2.9 10*3/uL (ref 1.7–7.7)
PLATELETS: 260 10*3/uL (ref 150–400)
RBC: 4.86 MIL/uL (ref 4.22–5.81)
RDW: 14 % (ref 11.5–15.5)
WBC: 6.3 10*3/uL (ref 4.0–10.5)

## 2014-05-26 LAB — COMPLETE METABOLIC PANEL WITH GFR
ALK PHOS: 72 U/L (ref 39–117)
ALT: 10 U/L (ref 0–53)
AST: 16 U/L (ref 0–37)
Albumin: 4.2 g/dL (ref 3.5–5.2)
BILIRUBIN TOTAL: 0.3 mg/dL (ref 0.2–1.2)
BUN: 7 mg/dL (ref 6–23)
CO2: 27 mEq/L (ref 19–32)
Calcium: 9.5 mg/dL (ref 8.4–10.5)
Chloride: 103 mEq/L (ref 96–112)
Creat: 0.95 mg/dL (ref 0.50–1.35)
Glucose, Bld: 97 mg/dL (ref 70–99)
POTASSIUM: 4 meq/L (ref 3.5–5.3)
SODIUM: 138 meq/L (ref 135–145)
TOTAL PROTEIN: 6.9 g/dL (ref 6.0–8.3)

## 2014-05-26 LAB — TSH: TSH: 0.604 u[IU]/mL (ref 0.350–4.500)

## 2014-05-26 NOTE — Patient Instructions (Signed)
Smoking Cessation Quitting smoking is important to your health and has many advantages. However, it is not always easy to quit since nicotine is a very addictive drug. Oftentimes, people try 3 times or more before being able to quit. This document explains the best ways for you to prepare to quit smoking. Quitting takes hard work and a lot of effort, but you can do it. ADVANTAGES OF QUITTING SMOKING  You will live longer, feel better, and live better.  Your body will feel the impact of quitting smoking almost immediately.  Within 20 minutes, blood pressure decreases. Your pulse returns to its normal level.  After 8 hours, carbon monoxide levels in the blood return to normal. Your oxygen level increases.  After 24 hours, the chance of having a heart attack starts to decrease. Your breath, hair, and body stop smelling like smoke.  After 48 hours, damaged nerve endings begin to recover. Your sense of taste and smell improve.  After 72 hours, the body is virtually free of nicotine. Your bronchial tubes relax and breathing becomes easier.  After 2 to 12 weeks, lungs can hold more air. Exercise becomes easier and circulation improves.  The risk of having a heart attack, stroke, cancer, or lung disease is greatly reduced.  After 1 year, the risk of coronary heart disease is cut in half.  After 5 years, the risk of stroke falls to the same as a nonsmoker.  After 10 years, the risk of lung cancer is cut in half and the risk of other cancers decreases significantly.  After 15 years, the risk of coronary heart disease drops, usually to the level of a nonsmoker.  If you are pregnant, quitting smoking will improve your chances of having a healthy baby.  The people you live with, especially any children, will be healthier.  You will have extra money to spend on things other than cigarettes. QUESTIONS TO THINK ABOUT BEFORE ATTEMPTING TO QUIT You may want to talk about your answers with your  health care provider.  Why do you want to quit?  If you tried to quit in the past, what helped and what did not?  What will be the most difficult situations for you after you quit? How will you plan to handle them?  Who can help you through the tough times? Your family? Friends? A health care provider?  What pleasures do you get from smoking? What ways can you still get pleasure if you quit? Here are some questions to ask your health care provider:  How can you help me to be successful at quitting?  What medicine do you think would be best for me and how should I take it?  What should I do if I need more help?  What is smoking withdrawal like? How can I get information on withdrawal? GET READY  Set a quit date.  Change your environment by getting rid of all cigarettes, ashtrays, matches, and lighters in your home, car, or work. Do not let people smoke in your home.  Review your past attempts to quit. Think about what worked and what did not. GET SUPPORT AND ENCOURAGEMENT You have a better chance of being successful if you have help. You can get support in many ways.  Tell your family, friends, and coworkers that you are going to quit and need their support. Ask them not to smoke around you.  Get individual, group, or telephone counseling and support. Programs are available at local hospitals and health centers. Call   your local health department for information about programs in your area.  Spiritual beliefs and practices may help some smokers quit.  Download a "quit meter" on your computer to keep track of quit statistics, such as how long you have gone without smoking, cigarettes not smoked, and money saved.  Get a self-help book about quitting smoking and staying off tobacco. LEARN NEW SKILLS AND BEHAVIORS  Distract yourself from urges to smoke. Talk to someone, go for a walk, or occupy your time with a task.  Change your normal routine. Take a different route to work.  Drink tea instead of coffee. Eat breakfast in a different place.  Reduce your stress. Take a hot bath, exercise, or read a book.  Plan something enjoyable to do every day. Reward yourself for not smoking.  Explore interactive web-based programs that specialize in helping you quit. GET MEDICINE AND USE IT CORRECTLY Medicines can help you stop smoking and decrease the urge to smoke. Combining medicine with the above behavioral methods and support can greatly increase your chances of successfully quitting smoking.  Nicotine replacement therapy helps deliver nicotine to your body without the negative effects and risks of smoking. Nicotine replacement therapy includes nicotine gum, lozenges, inhalers, nasal sprays, and skin patches. Some may be available over-the-counter and others require a prescription.  Antidepressant medicine helps people abstain from smoking, but how this works is unknown. This medicine is available by prescription.  Nicotinic receptor partial agonist medicine simulates the effect of nicotine in your brain. This medicine is available by prescription. Ask your health care provider for advice about which medicines to use and how to use them based on your health history. Your health care provider will tell you what side effects to look out for if you choose to be on a medicine or therapy. Carefully read the information on the package. Do not use any other product containing nicotine while using a nicotine replacement product.  RELAPSE OR DIFFICULT SITUATIONS Most relapses occur within the first 3 months after quitting. Do not be discouraged if you start smoking again. Remember, most people try several times before finally quitting. You may have symptoms of withdrawal because your body is used to nicotine. You may crave cigarettes, be irritable, feel very hungry, cough often, get headaches, or have difficulty concentrating. The withdrawal symptoms are only temporary. They are strongest  when you first quit, but they will go away within 10-14 days. To reduce the chances of relapse, try to:  Avoid drinking alcohol. Drinking lowers your chances of successfully quitting.  Reduce the amount of caffeine you consume. Once you quit smoking, the amount of caffeine in your body increases and can give you symptoms, such as a rapid heartbeat, sweating, and anxiety.  Avoid smokers because they can make you want to smoke.  Do not let weight gain distract you. Many smokers will gain weight when they quit, usually less than 10 pounds. Eat a healthy diet and stay active. You can always lose the weight gained after you quit.  Find ways to improve your mood other than smoking. FOR MORE INFORMATION  www.smokefree.gov  Document Released: 08/21/2001 Document Revised: 01/11/2014 Document Reviewed: 12/06/2011 ExitCare Patient Information 2015 [employer]. This information is not intended to replace advice given to you by your health care provider. Make sure you discuss any questions you have with your health care provider.  

## 2014-05-26 NOTE — Progress Notes (Signed)
Pt here for hospital follow-up Pt has tooth ache/abscess and request referral to dentist Pt has inflammation of stomach for taking too many ibuprofens Pt has rash on chest Pt needs medication filled

## 2014-05-26 NOTE — Progress Notes (Signed)
Patient Demographics  Constantino Starace, is a 43 y.o. male  ZOX:[id number]  [id number]  DOB - 06/08/1971  CC:  Chief Complaint  Patient presents with  . Hospitalization Follow-up       HPI: Jeremy Randall is a 43 y.o. male here today to establish medical care.Patient recently went to the emergency room with the symptoms of dental pain, EMR reviewed patient had a dental abscess which was drained, patient was prescribed  antibiotic, also history of gastritis secondary to NSAID was prescribed Protonix. As per patient he has not filled in the prescriptions because he could not afford but reports improvement in the symptoms of dental pain since incision and drainage was done but he is requesting referral to see a dentist, also history of A. fib in the past which was converted to sinus rhythm when he was hospitalized in 2013, currently denies any chest pain or shortness of breath. Patient has No headache, No chest pain, No abdominal pain - No Nausea, No new weakness tingling or numbness, No Cough - SOB.  No Known Allergies History reviewed. No pertinent past medical history. Current Outpatient Prescriptions on File Prior to Visit  Medication Sig Dispense Refill  . clindamycin (CLEOCIN) 150 MG capsule Take 3 capsules (450 mg total) by mouth 3 (three) times daily.  90 capsule  0  . ibuprofen (ADVIL,MOTRIN) 200 MG tablet Take 600 mg by mouth every 6 (six) hours as needed for pain.      Marland Kitchen omeprazole (PRILOSEC) 20 MG capsule Take 1 capsule (20 mg total) by mouth daily.  30 capsule  0  . oxyCODONE-acetaminophen (PERCOCET/ROXICET) 5-325 MG per tablet Take 1-2 tablets by mouth every 4 (four) hours as needed for moderate pain or severe pain.  10 tablet  0   No current facility-administered medications on file prior to visit.   Family History  Problem Relation Age of Onset  . Diabetes Mother   . Hypertension Mother   . Cancer Sister   . Hypertension Sister   . Hypertension Father   . Diabetes  Maternal Aunt    History   Social History  . Marital Status: Single    Spouse Name: N/A    Number of Children: N/A  . Years of Education: N/A   Occupational History  . Not on file.   Social History Main Topics  . Smoking status: Current Every Day Smoker -- 0.50 packs/day for 25 years    Types: Cigarettes  . Smokeless tobacco: Not on file  . Alcohol Use: Yes     Comment: rare  . Drug Use: Yes    Special: Marijuana     Comment: occ  . Sexual Activity: Not on file   Other Topics Concern  . Not on file   Social History Narrative  . No narrative on file    Review of Systems: Constitutional: Negative for fever, chills, diaphoresis, activity change, appetite change and fatigue. HENT: Negative for ear pain, nosebleeds, congestion, facial swelling, rhinorrhea, neck pain, neck stiffness and ear discharge.  Eyes: Negative for pain, discharge, redness, itching and visual disturbance. Respiratory: Negative for cough, choking, chest tightness, shortness of breath, wheezing and stridor.  Cardiovascular: Negative for chest pain, palpitations and leg swelling. Gastrointestinal: Negative for abdominal distention. Genitourinary: Negative for dysuria, urgency, frequency, hematuria, flank pain, decreased urine volume, difficulty urinating and dyspareunia.  Musculoskeletal: Negative for back pain, joint swelling, arthralgia and gait problem. Neurological: Negative for dizziness, tremors, seizures, syncope, facial asymmetry, speech difficulty, weakness, light-headedness,  numbness and headaches.  Hematological: Negative for adenopathy. Does not bruise/bleed easily. Psychiatric/Behavioral: Negative for hallucinations, behavioral problems, confusion, dysphoric mood, decreased concentration and agitation.    Objective:   Filed Vitals:   05/26/14 0924  BP: 132/85  Pulse: 58  Temp: 98.2 F (36.8 C)  Resp: 17    Physical Exam: Constitutional: Patient appears well-developed and  well-nourished. No distress. HENT: Normocephalic, atraumatic, External right and left ear normal. Oropharynx is clear and moist.  Eyes: Conjunctivae and EOM are normal. PERRLA, no scleral icterus. Neck: Normal ROM. Neck supple. No JVD. No tracheal deviation. No thyromegaly. CVS: RRR, S1/S2 +, no murmurs, no gallops, no carotid bruit.  Pulmonary: Effort and breath sounds normal, no stridor, rhonchi, wheezes, rales.  Abdominal: Soft. BS +, no distension, tenderness, rebound or guarding.  Musculoskeletal: Normal range of motion. No edema and no tenderness.  Neuro: Alert. Normal reflexes, muscle tone coordination. No cranial nerve deficit. Skin: Skin is warm and dry. No rash noted. Not diaphoretic. No erythema. No pallor. Psychiatric: Normal mood and affect. Behavior, judgment, thought content normal.  Lab Results  Component Value Date   WBC 11.3* 05/10/2012   HGB 15.5 05/10/2012   HCT 44.2 05/10/2012   MCV 88.4 05/10/2012   PLT 233 05/10/2012   Lab Results  Component Value Date   CREATININE 1.11 05/11/2012   BUN 13 05/11/2012   NA 139 05/11/2012   K 4.1 05/11/2012   CL 107 05/11/2012   CO2 26 05/11/2012    No results found for this basename: HGBA1C   Lipid Panel     Component Value Date/Time   CHOL 194 11/09/2009 2150   TRIG 81 11/09/2009 2150   HDL 49 11/09/2009 2150   CHOLHDL 4.0 Ratio 11/09/2009 2150   VLDL 16 11/09/2009 2150   LDLCALC 129* 11/09/2009 2150       Assessment and plan:   1. Pain, dental  - Ambulatory referral to Dentistry  2. Screening Ordered baseline blood work. - CBC with Differential - COMPLETE METABOLIC PANEL WITH GFR - TSH - Lipid panel - Vit D  25 hydroxy (rtn osteoporosis monitoring)  3. H/O dental abscess Status post incision and drainage. Patient will start taking antibiotic.  4. Gastritis Patient has prescription for Protonix he is going to fill in today.  5. Smoking Advised patient to quit smoking.  6. Preventative health care  - Tdap vaccine greater  than or equal to 7yo IM  7. Need for prophylactic vaccination and inoculation against influenza Flu shot given today.   Health Maintenance Flu shot given today.  Return in about 3 months (around 08/25/2014).    Doris Cheadle, MD

## 2014-05-27 LAB — VITAMIN D 25 HYDROXY (VIT D DEFICIENCY, FRACTURES): VIT D 25 HYDROXY: 15 ng/mL — AB (ref 30–89)

## 2015-09-17 ENCOUNTER — Encounter (HOSPITAL_COMMUNITY): Payer: Self-pay | Admitting: Emergency Medicine

## 2015-09-17 ENCOUNTER — Emergency Department (HOSPITAL_COMMUNITY)
Admission: EM | Admit: 2015-09-17 | Discharge: 2015-09-17 | Disposition: A | Discharge status: Home / Self Care | Payer: Self-pay | Attending: Emergency Medicine | Admitting: Emergency Medicine

## 2015-09-17 DIAGNOSIS — Z792 Long term (current) use of antibiotics: Secondary | ICD-10-CM | POA: Insufficient documentation

## 2015-09-17 DIAGNOSIS — Z79899 Other long term (current) drug therapy: Secondary | ICD-10-CM | POA: Insufficient documentation

## 2015-09-17 DIAGNOSIS — K0889 Other specified disorders of teeth and supporting structures: Secondary | ICD-10-CM | POA: Insufficient documentation

## 2015-09-17 DIAGNOSIS — F17210 Nicotine dependence, cigarettes, uncomplicated: Secondary | ICD-10-CM | POA: Insufficient documentation

## 2015-09-17 MED ORDER — HYDROCODONE-ACETAMINOPHEN 5-325 MG PO TABS: 2.0000 | ORAL_TABLET | ORAL | Status: DC | PRN

## 2015-09-17 MED ORDER — AMOXICILLIN 500 MG PO CAPS: 500.0000 mg | ORAL_CAPSULE | Freq: Three times a day (TID) | ORAL | Status: DC

## 2015-09-17 NOTE — Discharge Instructions (Signed)
Dental Pain Dental pain may be caused by many things, including:  Tooth decay (cavities or caries). Cavities expose the nerve of your tooth to air and hot or cold temperatures. This can cause pain or discomfort.  Abscess or infection. A dental abscess is a collection of infected pus from a bacterial infection in the inner part of the tooth (pulp). It usually occurs at the end of the tooth's root.  Injury.  An unknown reason (idiopathic). Your pain may be mild or severe. It may only occur when:  You are chewing.  You are exposed to hot or cold temperature.  You are eating or drinking sugary foods or beverages, such as soda or candy. Your pain may also be constant. HOME CARE INSTRUCTIONS Watch your dental pain for any changes. The following actions may help to lessen any discomfort that you are feeling:  Take medicines only as directed by your dentist.  If you were prescribed an antibiotic medicine, finish all of it even if you start to feel better.  Keep all follow-up visits as directed by your dentist. This is important.  Do not apply heat to the outside of your face.  Rinse your mouth or gargle with salt water if directed by your dentist. This helps with pain and swelling.  You can make salt water by adding  tsp of salt to 1 cup of warm water.  Apply ice to the painful area of your face:  Put ice in a plastic bag.  Place a towel between your skin and the bag.  Leave the ice on for 20 minutes, 2-3 times per day.  Avoid foods or drinks that cause you pain, such as:  Very hot or very cold foods or drinks.  Sweet or sugary foods or drinks. SEEK MEDICAL CARE IF:  Your pain is not controlled with medicines.  Your symptoms are worse.  You have new symptoms. SEEK IMMEDIATE MEDICAL CARE IF:  You are unable to open your mouth.  You are having trouble breathing or swallowing.  You have a fever.  Your face, neck, or jaw is swollen.   This information is not  intended to replace advice given to you by your health care provider. Make sure you discuss any questions you have with your health care provider.    Emergency Department Resource Guide 1) Find a Doctor and Pay Out of Pocket Although you won't have to find out who is covered by your insurance plan, it is a good idea to ask around and get recommendations. You will then need to call the office and see if the doctor you have chosen will accept you as a new patient and what types of options they offer for patients who are self-pay. Some doctors offer discounts or will set up payment plans for their patients who do not have insurance, but you will need to ask so you aren't surprised when you get to your appointment.  2) Contact Your Local Health Department Not all health departments have doctors that can see patients for sick visits, but many do, so it is worth a call to see if yours does. If you don't know where your local health department is, you can check in your phone book. The CDC also has a tool to help you locate your state's health department, and many state websites also have listings of all of their local health departments.  3) Find a Walk-in Clinic If your illness is not likely to be very severe or complicated, you  may want to try a walk in clinic. These are popping up all over the country in pharmacies, drugstores, and shopping centers. They're usually staffed by nurse practitioners or physician assistants that have been trained to treat common illnesses and complaints. They're usually fairly quick and inexpensive. However, if you have serious medical issues or chronic medical problems, these are probably not your best option.  No Primary Care Doctor: - Call Health Connect at  [phone] - they can help you locate a primary care doctor that  accepts your insurance, provides certain services, etc. - Physician Referral Service- [phone]  Chronic Pain Problems: Organization          Address  Phone   Notes  [facility]  [phone]) [phone] Patients need to be referred by their primary care doctor.   Medication Assistance: Organization         Address  Phone   Notes  [facility]ication [facility] [address] [place], [place] [address] [phone]) [phone] --Must be a resident of [facility] -- Must have NO insurance coverage whatsoever (no Medicaid/ Medicare, etc.) -- The pt. MUST have a primary care doctor that directs their care regularly and follows them in the community   Med[facility]ist  [phone]   [employer]  [phone]    Agencies that provide inexpensive medical care: Organization         Address  Phone   Notes  [facility]  [phone]   [facility]    [phone]   [facility] [address] [place], [place] [address] [phone]) [phone]   [facility] [address], [place] [phone]) [phone]   Planned Parenthood    [phone]   [facility]    [phone]   [facility] and [facility]  [address], [place] Phone:  [phone](336) [phone], Fax:  [phone](336) [phone] Hours of Operation:  9 am - 6 pm, M-F.  Also accepts Medicaid/Medicare and self-pay.  [facility]  [address], [place] Phone: [phone], Fax: [phone]. Hours of Operation:  8:30 am - 5:30 pm, M-F.  Also accepts Medicaid and self-pay.  [facility] [place] [address], [place] Phone: [phone]) [phone]   [facility] [address], [place], [place] [phone], Ext. [phone] Mondays & Thursdays: 7-9 AM.  First 15 patients are seen on a first come, first serve basis.    Medicaid-accepting [facility] Providers:  Organization         Address  Phone   Notes  [facility] [address], [place] [phone]) [phone] Also accepts self-pay patients.  [facility] [address], [place]  [phone]) [phone]   [facility] [address], [place] [phone]) [phone]   [facility] [address] [place] [address], [place] [phone]) [phone]   Renaye RakersVeita Bland [address], [place]   [phone] Only accepts [place] Access [place] patients after they have their name applied to their card.   Self-Pay (no insurance) in [facility]:  Organization         Address  Phone   Notes  Sickle Cell Patients, [facility] [address], [place] [phone]) [phone]   [facility] [address], [place] (  [phone]   [facility]  [address], [place] [phone]   [facility]/Dr. Osei-Bonsu  [address], [place] or [address], [place] [phone] Phone number for both [place] and [place] locations is the same.  [facility] [address], [place] [phone]   [facility] [address], [place] or [address] [phone]   [facility] [address], [place] [phone], phone; [phone], fax Sees patients 1st and 3rd Saturday of every month.  Must not qualify for public or private insurance (i.e. Medicaid, Medicare, [place] Health Choice, Veterans' Benefits)  Household income should be no more than 200% of the poverty level The clinic cannot treat you if you are pregnant or think you are pregnant  Sexually transmitted diseases are not treated at the clinic.    Dental Care: Organization         Address  Phone  Notes  [facility] of [facility] [address] [place], [place] [phone] Accepts children up to age 30 who are enrolled in [place] or [place] Health Choice; pregnant women with a Medicaid card; and  children who have applied for Medicaid or [place] Health Choice, but were declined, whose parents can pay a reduced fee at time of service.  [facility]  [address], [place] [phone] Accepts children up to age 31 who are enrolled in [place] or [place] Health Choice; pregnant women with a Medicaid card; and children who have applied for Medicaid or [place] Health Choice, but were declined, whose parents can pay a reduced fee at time of service.  [facility]  [address] [place], [place] [phone] Patients are seen by appointment only. Walk-ins are not accepted. [facility] will see patients 46 years of age and older. Monday - Tuesday (8am-5pm) Most Wednesdays (8:30-5pm) $30 per visit, cash only  [facility] Access [facility]  [address], [facility] [phone] Patients are seen by appointment only. Walk-ins are not accepted. [facility] will see patients 49 years of age and older. One Wednesday Evening (Monthly: Volunteer Based).  $30 per visit, cash only  [employer]  [phone] for adults; Children under age 40, call Graduate Pediatric Dentistry at [phone]. Children aged 28-14, please call [phone] to request a pediatric application.  Dental services are provided in all areas of dental care including fillings, crowns and bridges, complete and partial dentures, implants, gum treatment, root canals, and extractions. Preventive care is also provided. Treatment is provided to both adults and children. Patients are selected via a lottery and there is often a waiting list.   [facility] [address], [place]  [phone] [email or web address]   [facility] [address] [place], [place] [phone], Ext. [phone] Second and Fourth Thursday of each month, opens at 6:30 AM; Clinic ends at 9 AM.  Patients are seen on a first-come first-served  basis, and a limited number are seen during each clinic.   [facility]  [address] [place], [place] [phone]   Eligibility Requirements You must have lived in [place], [place], or [place] counties for at least the last three months.   You cannot be eligible for  state or Teacher, music, including [employer], [place], or [employer].   You generally cannot be eligible for healthcare insurance through your employer.    How to apply: Eligibility screenings are held every Tuesday and Wednesday afternoon from 1:00 pm until 4:00 pm. You do not need an appointment for the interview!  [facility] [address], [place], [place] [phone]   [facility] Health Department  [phone]   [facility] Health Department  [phone]   [facility] Health Department  [phone]    Behavioral Health Resources in the Community: Intensive Outpatient Programs Organization         Address  Phone  Notes  [facility] [address] [place], [place] [phone]   [facility] [address], [place], [place] [phone]   [facility] [address] [place], [place]  [phone]   [facility] Mental Health [address]  [place], [place] [phone] or [phone]   Substance Abuse Resources Organization         Address  Phone  Notes  Al[facility] Drug Services  [phone]   [facility]  [phone]   [facility] [place]  [phone]   Floydene Flock  [phone]   Residential & Outpatient Substance Abuse Program  4791489386   Psychological Services Organization         Address  Phone  Notes  [facility] Behavioral Health  [phone]   [facility]  [phone]   [facility] Mental Health [address] [place] [phone] or [phone]    Mobile Crisis Teams Organization          Address  Phone  Notes  [facility], Mobile Crisis Care Unit  [phone]   [facility]  [address] [place], [place] [phone]   Doristine Locks [address] [place] [phone]    Self-Help/Support Groups Organization         Address  Phone             Notes  [facility] [place] - variety of support groups  336- I7437963 Call for more information  Narcotics Anonymous (NA), [facility] [address], [employer] [place]  2 meetings at this location   Statistician         Address  Phone  Notes  [facility] [address],    [place]  [phone]   [facility]  [address], [place] [address], [place], [place] [phone]   [facility] Treatment [facility] [address] [place], [place] [phone] Admissions: 8am-3pm M-F  [facility] [address]    [place], [place] [phone]   [facility] [address] [place] [place], [place] [phone]   [facility] [address]  [place], [place] [phone]   [facility] - Intensive Outpatient [address], [place], [place] [phone]   [facility] (Addiction Recovery [facility] [address] [place].,  [place], [place] [phone] or [phone]   [facility] (RTS) [address], [place], [place] [phone] Accepts Medicaid  [facility] [address]  [place] [phone] Substance Abuse/Addiction Treatment   [facility] Organization         Address  Phone  Notes  [facility]  [phone]   Angie Fava, PhD [address] [place], [place]   [phone]  [phone]) X3202989 or [phone]) [phone]   [facility]   [address] [place], [place] [phone]   [facility] [address] [place], [place] [phone] Insurance/Medicaid/sponsorship  through [facility] and Families [address]                                    [place], [place] [phone] Therapy/tele-psych/case  [facility] [address]   [place], [place] [phone]    Dr. Lolly Mustache  [phone]   [facility] [place]  [employer] [facility]. 1) [address], [place] 2) [address] [place] 3)  [address], [place] [phone]  [phone]   [facility] Child Abuse Hotline (760)641-8780 or [phone] (After Hours)

## 2015-09-17 NOTE — ED Notes (Signed)
Per pt, states upper right dental pain for a few days

## 2015-11-01 NOTE — ED Provider Notes (Signed)
CSN: [id number]     Arrival date & time 09/17/15  1640 History   First MD Initiated Contact with Patient 09/17/15 1708     Chief Complaint  Patient presents with  . Dental Pain     (Consider location/radiation/quality/duration/timing/severity/associated sxs/prior Treatment) Patient is a 45 y.o. male presenting with tooth pain. The history is provided by the patient. No language interpreter was used.  Dental Pain Location:  Generalized Severity:  Moderate Timing:  Constant Chronicity:  New Relieved by:  Nothing Worsened by:  Nothing tried Ineffective treatments:  None tried   History reviewed. No pertinent past medical history. Past Surgical History  Procedure Laterality Date  . Patellar tendon repair    . Appendectomy     Family History  Problem Relation Age of Onset  . Diabetes Mother   . Hypertension Mother   . Cancer Sister   . Hypertension Sister   . Hypertension Father   . Diabetes Maternal Aunt    Social History  Substance Use Topics  . Smoking status: Current Every Day Smoker -- 0.50 packs/day for 25 years    Types: Cigarettes  . Smokeless tobacco: None  . Alcohol Use: Yes     Comment: rare    Review of Systems  All other systems reviewed and are negative.     Allergies  Review of patient's allergies indicates no known allergies.  Home Medications   Prior to Admission medications   Medication Sig Start Date End Date Taking? Authorizing Provider  amoxicillin (AMOXIL) 500 MG capsule Take 1 capsule (500 mg total) by mouth 3 (three) times daily. 09/17/15   Elson Areas, [name]  clindamycin (CLEOCIN) 150 MG capsule Take 3 capsules (450 mg total) by mouth 3 (three) times daily. 05/17/14   Hannah Muthersbaugh, [name]  HYDROcodone-acetaminophen (NORCO/VICODIN) 5-325 MG tablet Take 2 tablets by mouth every 4 (four) hours as needed. 09/17/15   Elson Areas, [name]  ibuprofen (ADVIL,MOTRIN) 200 MG tablet Take 600 mg by mouth every 6 (six) hours as needed for pain.     Historical Provider, MD  omeprazole (PRILOSEC) 20 MG capsule Take 1 capsule (20 mg total) by mouth daily. 05/17/14   Hannah Muthersbaugh, [name]  oxyCODONE-acetaminophen (PERCOCET/ROXICET) 5-325 MG per tablet Take 1-2 tablets by mouth every 4 (four) hours as needed for moderate pain or severe pain. 05/17/14   Hannah Muthersbaugh, [name]   BP 134/88 mmHg  Pulse 66  Temp(Src) 98.9 F (37.2 C) (Oral)  Resp 16  SpO2 100% Physical Exam  Constitutional: He is oriented to person, place, and time. He appears well-developed and well-nourished.  HENT:  Head: Normocephalic.  Painful tooth and gum tender   Eyes: EOM are normal.  Neck: Normal range of motion.  Pulmonary/Chest: Effort normal.  Abdominal: He exhibits no distension.  Musculoskeletal: Normal range of motion.  Neurological: He is alert and oriented to person, place, and time.  Psychiatric: He has a normal mood and affect.  Nursing note and vitals reviewed.   ED Course  Procedures (including critical care time) Labs Review Labs Reviewed - No data to display  Imaging Review No results found. I have personally reviewed and evaluated these images and lab results as part of my medical decision-making.   EKG Interpretation None      MDM   Final diagnoses:  Toothache    Meds ordered this encounter  Medications  . HYDROcodone-acetaminophen (NORCO/VICODIN) 5-325 MG tablet    Sig: Take 2 tablets by mouth every 4 (four) hours as  needed.    Dispense:  10 tablet    Refill:  0    Order Specific Question:  Supervising Provider    Answer:  Hyacinth Meeker, BRIAN [[id number]]  . amoxicillin (AMOXIL) 500 MG capsule    Sig: Take 1 capsule (500 mg total) by mouth 3 (three) times daily.    Dispense:  30 capsule    Refill:  0    Order Specific Question:  Supervising Provider    Answer:  Eber Hong [[id number]]      Lonia Skinner [place], [name] 11/01/15 1617  Loren Racer, MD 11/03/15 [phone]

## 2017-01-28 ENCOUNTER — Encounter (INDEPENDENT_AMBULATORY_CARE_PROVIDER_SITE_OTHER): Payer: Self-pay | Admitting: Physician Assistant

## 2017-01-28 ENCOUNTER — Ambulatory Visit (INDEPENDENT_AMBULATORY_CARE_PROVIDER_SITE_OTHER): Payer: Self-pay | Admitting: Physician Assistant

## 2017-01-28 VITALS — BP 144/92 | HR 59 | Temp 98.0°F | Ht 74.0 in | Wt 185.8 lb

## 2017-01-28 DIAGNOSIS — Z131 Encounter for screening for diabetes mellitus: Secondary | ICD-10-CM

## 2017-01-28 DIAGNOSIS — K648 Other hemorrhoids: Secondary | ICD-10-CM

## 2017-01-28 DIAGNOSIS — I10 Essential (primary) hypertension: Secondary | ICD-10-CM

## 2017-01-28 DIAGNOSIS — B354 Tinea corporis: Secondary | ICD-10-CM

## 2017-01-28 LAB — POCT GLYCOSYLATED HEMOGLOBIN (HGB A1C): Hemoglobin A1C: 5.8

## 2017-01-28 MED ORDER — HYDROCORTISONE ACETATE 25 MG RE SUPP: 25.0000 mg | Freq: Two times a day (BID) | RECTAL | 0 refills | Status: DC

## 2017-01-28 MED ORDER — FLUCONAZOLE 150 MG PO TABS: 150.0000 mg | ORAL_TABLET | ORAL | 0 refills | Status: DC

## 2017-01-28 MED ORDER — DOCUSATE SODIUM 50 MG PO CAPS: 50.0000 mg | ORAL_CAPSULE | Freq: Two times a day (BID) | ORAL | 0 refills | Status: DC

## 2017-01-28 MED FILL — FLUCONAZOLE 150 MG TABLET: 150 | 21 days supply | Qty: 3 | Fill #0

## 2017-01-28 MED FILL — HYDROCORTISONE AC 25 MG SUP: 25 | 7 days supply | Qty: 14 | Fill #0

## 2017-01-28 NOTE — Patient Instructions (Addendum)
Diabetes Mellitus and Food It is important for you to manage your blood sugar (glucose) level. Your blood glucose level can be greatly affected by what you eat. Eating healthier foods in the appropriate amounts throughout the day at about the same time each day will help you control your blood glucose level. It can also help slow or prevent worsening of your diabetes mellitus. Healthy eating may even help you improve the level of your blood pressure and reach or maintain a healthy weight. General recommendations for healthful eating and cooking habits include:  Eating meals and snacks regularly. Avoid going long periods of time without eating to lose weight.  Eating a diet that consists mainly of plant-based foods, such as fruits, vegetables, nuts, legumes, and whole grains.  Using low-heat cooking methods, such as baking, instead of high-heat cooking methods, such as deep frying. Work with your dietitian to make sure you understand how to use the Nutrition Facts information on food labels. How can food affect me? Carbohydrates  Carbohydrates affect your blood glucose level more than any other type of food. Your dietitian will help you determine how many carbohydrates to eat at each meal and teach you how to count carbohydrates. Counting carbohydrates is important to keep your blood glucose at a healthy level, especially if you are using insulin or taking certain medicines for diabetes mellitus. Alcohol  Alcohol can cause sudden decreases in blood glucose (hypoglycemia), especially if you use insulin or take certain medicines for diabetes mellitus. Hypoglycemia can be a life-threatening condition. Symptoms of hypoglycemia (sleepiness, dizziness, and disorientation) are similar to symptoms of having too much alcohol. If your health care provider has given you approval to drink alcohol, do so in moderation and use the following guidelines:  Women should not have more than one drink per day, and men  should not have more than two drinks per day. One drink is equal to:  12 oz of beer.  5 oz of wine.  1 oz of hard liquor.  Do not drink on an empty stomach.  Keep yourself hydrated. Have water, diet soda, or unsweetened iced tea.  Regular soda, juice, and other mixers might contain a lot of carbohydrates and should be counted. What foods are not recommended? As you make food choices, it is important to remember that all foods are not the same. Some foods have fewer nutrients per serving than other foods, even though they might have the same number of calories or carbohydrates. It is difficult to get your body what it needs when you eat foods with fewer nutrients. Examples of foods that you should avoid that are high in calories and carbohydrates but low in nutrients include:  Trans fats (most processed foods list trans fats on the Nutrition Facts label).  Regular soda.  Juice.  Candy.  Sweets, such as cake, pie, doughnuts, and cookies.  Fried foods. What foods can I eat? Eat nutrient-rich foods, which will nourish your body and keep you healthy. The food you should eat also will depend on several factors, including:  The calories you need.  The medicines you take.  Your weight.  Your blood glucose level.  Your blood pressure level.  Your cholesterol level. You should eat a variety of foods, including:  Protein.  Lean cuts of meat.  Proteins low in saturated fats, such as fish, egg whites, and beans. Avoid processed meats.  Fruits and vegetables.  Fruits and vegetables that may help control blood glucose levels, such as apples, mangoes, and   yams.  Dairy products.  Choose fat-free or low-fat dairy products, such as milk, yogurt, and cheese.  Grains, bread, pasta, and rice.  Choose whole grain products, such as multigrain bread, whole oats, and brown rice. These foods may help control blood pressure.  Fats.  Foods containing healthful fats, such as nuts,  avocado, olive oil, canola oil, and fish. Does everyone with diabetes mellitus have the same meal plan? Because every person with diabetes mellitus is different, there is not one meal plan that works for everyone. It is very important that you meet with a dietitian who will help you create a meal plan that is just right for you. This information is not intended to replace advice given to you by your health care provider. Make sure you discuss any questions you have with your health care provider. Document Released: 05/24/2005 Document Revised: 02/02/2016 Document Reviewed: 07/24/2013 Elsevier Interactive Patient Education  2017 Elsevier Inc.     Hemorroides (Hemorrhoids) Las hemorroides son venas inflamadas adentro o alrededor del recto o del ano. Hay dos tipos de hemorroides:  Hemorroides internas. Se forman en las venas del interior del recto. Pueden abultarse hacia afuera, irritarse y doler.  Hemorroides externas. Se producen en las venas externas del ano y pueden sentirse como un bulto o zona hinchada, dura y dolorosa cerca del ano. La mayora de las hemorroides no causan problemas graves y se Sports coachpueden controlar con tratamientos caseros Lubrizol Corporationcomo los cambios en la dieta y el estilo de vida. Si los tratamientos caseros no ayudan con los sntomas, se pueden Education officer, environmentalrealizar procedimientos para reducir o extirpar las hemorroides. CAUSAS La causa de esta afeccin es el aumento de la presin en la zona anal. Esta presin puede ser causada por distintos factores, por ejemplo:  Estreimiento.  Dificultad para defecar.  Diarrea.  Embarazo.  Obesidad.  Estar sentado durante largos perodos.  Levantar objetos pesados u otras actividades que impliquen esfuerzo.  Sexo anal. SNTOMAS Los sntomas de esta afeccin incluyen lo siguiente:  Dolor.  Picazn o irritacin anal.  Sangrado rectal.  Prdida de materia fecal (heces).  Inflamacin anal.  Uno o ms bultos alrededor del  ano. DIAGNSTICO Esta afeccin se diagnostica frecuentemente a travs de un examen visual. Posiblemente le realicen otros tipos de pruebas o estudios, como los siguientes:  Examen de la zona rectal con una mano enguantada (examen digital rectal).  Examen del canal anal utilizando un pequeo tubo (anoscopio).  Anlisis de sangre si ha perdido [place] cantidad significativa de [place]angre.  Un estudio para observar el interior del colon (sigmoidoscopia o colonoscopia). TRATAMIENTO Esta afeccin generalmente se puede tratar en el hogar. Se pueden realizar diversos procedimientos si los cambios en la dieta, en el estilo de vida y otros tratamientos caseros no Pulte Homesalivian los sntomas. Estos procedimientos pueden ayudar a reducir o extirpar las hemorroides completamente. Algunos de estos procedimientos son quirrgicos y otros no. Algunos de los procedimientos ms frecuentes son los siguientes:  Ligadura con Curatorbanda elstica. Las bandas elsticas se colocan en la base de las hemorroides para interrumpir la irrigacin de Cadillacsangre.  Escleroterapia. Se inyecta un medicamento en las hemorroides para reducir su tamao.  Coagulacin con luz infrarroja. Se utiliza un tipo de energa lumnica para eliminar las hemorroides.  Hemorroidectoma. Las hemorroides se extirpan con Azerbaijanciruga y las venas que las Spainirrigan se Web designeratan.  Hemorroidopexia con grapas. Se Bots[place]ausa un dispositivo tipo grapa de forma circular para extirpar las hemorroides y unas grapas para cortar la sangre que se irriga hacia las hemorroides.  INSTRUCCIONES PARA EL CUIDADO EN EL HOGAR Comida y bebida  Consuma alimentos con alto contenido de Aneta, como cereales integrales, porotos, frutos secos, frutas y verduras. Pregntele a su mdico acerca de tomar productos con fibra aadida en ellos (complementos de fibra).  Beba suficiente lquido para Photographer orina clara o de color amarillo plido. Control del dolor y la hinchazn  Tome baos de asiento tibios durante  20 minutos, 3 o 4 veces por da para Primary school teacher y las Daniel.  Si se lo indican, aplique hielo en la zona afectada. Usar compresas de [employer] baos de asiento puede ser Superior.  Ponga el hielo en una bolsa plstica.  Coloque una toalla entre la piel y la bolsa de hielo.  Coloque el hielo durante 20 minutos, 2 a 3 veces por da. Instrucciones generales  [employer] de venta libre y los recetados solamente como se lo haya indicado el mdico.  Aplquese los medicamentos, cremas o supositorios como se lo hayan indicado.  Haga ejercicios regularmente.  Vaya al bao cuando sienta la necesidad de defecar. No espere.  Evite hacer fuerza al defecar.  Mantenga la zona anal limpia y seca. Use papel higinico hmedo o toallitas humedecidas despus de defecar.  No pase mucho tiempo sentado en el inodoro. Esto aumenta la afluencia de sangre y Chief Technology Officer. SOLICITE ATENCIN MDICA SI:  Aumenta el dolor y la hinchazn, y no puede controlarlos con los medicamentos o con Pharmacist, community.  Tiene una hemorragia que no Magazine features editor.  No puede defecar o lo hace con dificultad.  Siente dolor o tiene inflamacin fuera de la zona de las hemorroides. Esta informacin no tiene Theme park manager el consejo del mdico. Asegrese de hacerle al mdico cualquier pregunta que tenga. Document Released: 08/27/2005 Document Revised: 12/19/2015 Document Reviewed: 05/11/2015 Elsevier Interactive Patient Education  2017 [employer].

## 2017-01-28 NOTE — Progress Notes (Signed)
Subjective:  Patient ID: Jeremy Randall, male    DOB: 10/13/1970  Age: 46 y.o. MRN: [id number]  CC: hemorrhoid, skin issues  HPI Jeremy Randall is a 46 y.o. male with a PMH of aFib presents with complaint of hemorrhoid and possible yeast in the groin and body.  Hemorroid currently causes mild discomfort. Attributed to previously straining with large formed stools. Also previously took narcotics for pain. Not currently straining or BRBPR. Says there is also itching skin in his groin area and some travelling up to torso. Denies any other symptoms.    Outpatient Medications Prior to Visit  Medication Sig Dispense Refill  . ibuprofen (ADVIL,MOTRIN) 200 MG tablet Take 600 mg by mouth every 6 (six) hours as needed for pain.    Marland Kitchen. amoxicillin (AMOXIL) 500 MG capsule Take 1 capsule (500 mg total) by mouth 3 (three) times daily. 30 capsule 0  . clindamycin (CLEOCIN) 150 MG capsule Take 3 capsules (450 mg total) by mouth 3 (three) times daily. (Patient not taking: Reported on 01/28/2017) 90 capsule 0  . HYDROcodone-acetaminophen (NORCO/VICODIN) 5-325 MG tablet Take 2 tablets by mouth every 4 (four) hours as needed. (Patient not taking: Reported on 01/28/2017) 10 tablet 0  . omeprazole (PRILOSEC) 20 MG capsule Take 1 capsule (20 mg total) by mouth daily. (Patient not taking: Reported on 01/28/2017) 30 capsule 0  . oxyCODONE-acetaminophen (PERCOCET/ROXICET) 5-325 MG per tablet Take 1-2 tablets by mouth every 4 (four) hours as needed for moderate pain or severe pain. (Patient not taking: Reported on 01/28/2017) 10 tablet 0   No facility-administered medications prior to visit.      ROS Review of Systems  Constitutional: Negative for chills, fever and malaise/fatigue.  Eyes: Negative for blurred vision.  Respiratory: Negative for shortness of breath.   Cardiovascular: Negative for chest pain and palpitations.  Gastrointestinal: Negative for abdominal pain and nausea.       Hemorrhoid  Genitourinary: Negative for  dysuria and hematuria.  Musculoskeletal: Negative for joint pain and myalgias.  Skin: Positive for itching and rash.  Neurological: Negative for tingling and headaches.  Psychiatric/Behavioral: Negative for depression. The patient is not nervous/anxious.     Objective:  BP (!) 144/92   Pulse (!) 59   Temp 98 F (36.7 C) (Oral)   Ht 6\' 2"  (1.88 m)   Wt 185 lb 12.8 oz (84.3 kg)   SpO2 100%   BMI 23.86 kg/m   BP/Weight 01/28/2017 09/17/2015 05/26/2014  Systolic BP 144 134 132  Diastolic BP 92 88 85  Wt. (Lbs) 185.8 - 198.2  BMI 23.86 - 26.87      Physical Exam  Constitutional: He is oriented to person, place, and time.  Well developed, well nourished, NAD, polite  HENT:  Head: Normocephalic and atraumatic.  Eyes: No scleral icterus.  Cardiovascular: Normal rate, regular rhythm and normal heart sounds.   Pulmonary/Chest: Effort normal and breath sounds normal.  Genitourinary:  Genitourinary Comments: Small nonthrombosed internal hemorrhoid.  Musculoskeletal: He exhibits no edema.  Neurological: He is alert and oriented to person, place, and time.  Skin: Skin is warm and dry.  Hyperpigmentation in the gluteal and perineal area with scaling.  Psychiatric: He has a normal mood and affect. His behavior is normal. Thought content normal.  Vitals reviewed.    Assessment & Plan:   1. Internal hemorrhoid - Begin hydrocortisone (ANUSOL-HC) 25 MG suppository; Place 1 suppository (25 mg total) rectally 2 (two) times daily.  Dispense: 14 suppository; Refill: 0  2. Screening  for diabetes mellitus - HgB A1c 5.8% in clinic today.  - Pt counseled on lowering his carbohydrate intake.   3. Elevated blood pressure reading in office with diagnosis of hypertension - Advised low sodium diet. No more than 2,000 mg per day. Return in 4 weeks for full physical.   4. Tinea corporis - Begin Fluconazole 150mg  once weekly x3 weeks.   Meds ordered this encounter  Medications  .  hydrocortisone (ANUSOL-HC) 25 MG suppository    Sig: Place 1 suppository (25 mg total) rectally 2 (two) times daily.    Dispense:  14 suppository    Refill:  0    Order Specific Question:   Supervising Provider    Answer:   Quentin Angst [[id number]]    Follow-up: Return in about 4 weeks (around 02/25/2017) for full physical.   Loletta Specter PA

## 2017-02-28 ENCOUNTER — Ambulatory Visit (INDEPENDENT_AMBULATORY_CARE_PROVIDER_SITE_OTHER): Payer: Self-pay | Admitting: Physician Assistant

## 2017-02-28 ENCOUNTER — Encounter (INDEPENDENT_AMBULATORY_CARE_PROVIDER_SITE_OTHER): Payer: Self-pay | Admitting: Physician Assistant

## 2017-02-28 VITALS — BP 128/71 | HR 64 | Temp 98.8°F | Resp 18 | Ht 74.0 in | Wt 185.0 lb

## 2017-02-28 DIAGNOSIS — Z0000 Encounter for general adult medical examination without abnormal findings: Secondary | ICD-10-CM

## 2017-02-28 DIAGNOSIS — B351 Tinea unguium: Secondary | ICD-10-CM

## 2017-02-28 DIAGNOSIS — Z125 Encounter for screening for malignant neoplasm of prostate: Secondary | ICD-10-CM

## 2017-02-28 DIAGNOSIS — K5900 Constipation, unspecified: Secondary | ICD-10-CM

## 2017-02-28 DIAGNOSIS — Z114 Encounter for screening for human immunodeficiency virus [HIV]: Secondary | ICD-10-CM

## 2017-02-28 DIAGNOSIS — K648 Other hemorrhoids: Secondary | ICD-10-CM

## 2017-02-28 DIAGNOSIS — B354 Tinea corporis: Secondary | ICD-10-CM

## 2017-02-28 MED ORDER — FLUCONAZOLE 150 MG PO TABS: 150.0000 mg | ORAL_TABLET | ORAL | 0 refills | Status: DC

## 2017-02-28 MED ORDER — DOCUSATE SODIUM 50 MG PO CAPS: 50.0000 mg | ORAL_CAPSULE | Freq: Two times a day (BID) | ORAL | 0 refills | Status: DC

## 2017-02-28 MED FILL — FLUCONAZOLE 150 MG TABLET: 150 | 9 days supply | Qty: 3 | Fill #0

## 2017-02-28 NOTE — Patient Instructions (Signed)
Hemorrhoids Hemorrhoids are swollen veins in and around the rectum or anus. There are two types of hemorrhoids:  Internal hemorrhoids. These occur in the veins that are just inside the rectum. They may poke through to the outside and become irritated and painful.  External hemorrhoids. These occur in the veins that are outside of the anus and can be felt as a painful swelling or hard lump near the anus.  Most hemorrhoids do not cause serious problems, and they can be managed with home treatments such as diet and lifestyle changes. If home treatments do not help your symptoms, procedures can be done to shrink or remove the hemorrhoids. What are the causes? This condition is caused by increased pressure in the anal area. This pressure may result from various things, including:  Constipation.  Straining to have a bowel movement.  Diarrhea.  Pregnancy.  Obesity.  Sitting for long periods of time.  Heavy lifting or other activity that causes you to strain.  Anal sex.  What are the signs or symptoms? Symptoms of this condition include:  Pain.  Anal itching or irritation.  Rectal bleeding.  Leakage of stool (feces).  Anal swelling.  One or more lumps around the anus.  How is this diagnosed? This condition can often be diagnosed through a visual exam. Other exams or tests may also be done, such as:  Examination of the rectal area with a gloved hand (digital rectal exam).  Examination of the anal canal using a small tube (anoscope).  A blood test, if you have lost a significant amount of blood.  A test to look inside the colon (sigmoidoscopy or colonoscopy).  How is this treated? This condition can usually be treated at home. However, various procedures may be done if dietary changes, lifestyle changes, and other home treatments do not help your symptoms. These procedures can help make the hemorrhoids smaller or remove them completely. Some of these procedures involve  surgery, and others do not. Common procedures include:  Rubber band ligation. Rubber bands are placed at the base of the hemorrhoids to cut off the blood supply to them.  Sclerotherapy. Medicine is injected into the hemorrhoids to shrink them.  Infrared coagulation. A type of light energy is used to get rid of the hemorrhoids.  Hemorrhoidectomy surgery. The hemorrhoids are surgically removed, and the veins that supply them are tied off.  Stapled hemorrhoidopexy surgery. A circular stapling device is used to remove the hemorrhoids and use staples to cut off the blood supply to them.  Follow these instructions at home: Eating and drinking  Eat foods that have a lot of fiber in them, such as whole grains, beans, nuts, fruits, and vegetables. Ask your health care provider about taking products that have added fiber (fiber supplements).  Drink enough fluid to keep your urine clear or pale yellow. Managing pain and swelling  Take warm sitz baths for 20 minutes, 3-4 times a day to ease pain and discomfort.  If directed, apply ice to the affected area. Using ice packs between sitz baths may be helpful. ? Put ice in a plastic bag. ? Place a towel between your skin and the bag. ? Leave the ice on for 20 minutes, 2-3 times a day. General instructions  Take over-the-counter and prescription medicines only as told by your health care provider.  Use medicated creams or suppositories as told.  Exercise regularly.  Go to the bathroom when you have the urge to have a bowel movement. Do not wait.    Avoid straining to have bowel movements.  Keep the anal area dry and clean. Use wet toilet paper or moist towelettes after a bowel movement.  Do not sit on the toilet for long periods of time. This increases blood pooling and pain. Contact a health care provider if:  You have increasing pain and swelling that are not controlled by treatment or medicine.  You have uncontrolled bleeding.  You  have difficulty having a bowel movement, or you are unable to have a bowel movement.  You have pain or inflammation outside the area of the hemorrhoids. This information is not intended to replace advice given to you by your health care provider. Make sure you discuss any questions you have with your health care provider. Document Released: 08/24/2000 Document Revised: 01/25/2016 Document Reviewed: 05/11/2015 Elsevier Interactive Patient Education  2017 Elsevier Inc.  

## 2017-02-28 NOTE — Progress Notes (Signed)
Subjective:  Patient ID: Jeremy Randall, male    DOB: 04/23/1971  Age: 46 y.o. MRN: [id number]  CC: annual exam HPI Jeremy Randall is a 46 y.o. male with a PMH of tinea corporis presents for an annual exam. Says his tinea corporis is almost resolved. Requests a few more diflucan to help completely clear. Also has fungus in the toe nails and requests treatment. Otherwise, feels well and has no complaints or other symptoms.   ROS Review of Systems  Constitutional: Negative for chills, fever and malaise/fatigue.  Eyes: Negative for blurred vision.  Respiratory: Negative for shortness of breath.   Cardiovascular: Negative for chest pain and palpitations.  Gastrointestinal: Negative for abdominal pain and nausea.  Genitourinary: Negative for dysuria and hematuria.  Musculoskeletal: Negative for joint pain and myalgias.  Skin: Positive for rash.       Toe nail fungus  Neurological: Negative for tingling and headaches.  Psychiatric/Behavioral: Negative for depression. The patient is not nervous/anxious.     Objective:  BP 128/71 (BP Location: Left Arm, Patient Position: Sitting, Cuff Size: Normal)   Pulse 64   Temp 98.8 F (37.1 C) (Oral)   Resp 18   Ht 6\' 2"  (1.88 m)   Wt 185 lb (83.9 kg)   SpO2 100%   BMI 23.75 kg/m   BP/Weight 02/28/2017 01/28/2017 09/17/2015  Systolic BP 128 144 134  Diastolic BP 71 92 88  Wt. (Lbs) 185 185.8 -  BMI 23.75 23.86 -      Physical Exam  Constitutional: He is oriented to person, place, and time.  Well developed, well nourished, NAD, polite  HENT:  Head: Normocephalic and atraumatic.  Eyes: No scleral icterus.  Neck: Normal range of motion. Neck supple. No thyromegaly present.  Cardiovascular: Normal rate, regular rhythm and normal heart sounds.   Pulmonary/Chest: Effort normal and breath sounds normal.  Abdominal: Soft. Bowel sounds are normal. There is no tenderness.  Genitourinary:  Genitourinary Comments: Internal hemorrhoid. Penis normal,  testicles normal, no inguinal hernia, prostate 20g, normal consistency, no nodules, no tenderness.  Musculoskeletal: He exhibits no edema.  Neurological: He is alert and oriented to person, place, and time.  Skin: Skin is warm and dry. No rash noted. No erythema. No pallor.  Psychiatric: He has a normal mood and affect. His behavior is normal. Thought content normal.  Vitals reviewed.    Assessment & Plan:   1. Annual physical exam - CBC with Differential - Comprehensive metabolic panel - Lipid Panel  2. Encounter for screening for HIV - HIV antibody  3. Internal hemorrhoid - refill docusate sodium (COLACE) 50 MG capsule; Take 1 capsule (50 mg total) by mouth 2 (two) times daily.  Dispense: 10 capsule; Refill: 0 - Ambulatory referral to Gastroenterology  4. Constipation, unspecified constipation type - refill docusate sodium (COLACE) 50 MG capsule; Take 1 capsule (50 mg total) by mouth 2 (two) times daily.  Dispense: 10 capsule; Refill: 0  5. Screening for prostate cancer - PSA  6. Tinea Corporis - Begin Fluconazole 150mg  q72hrs x3 tablets.  7. Onychomycosis - Will wait on LFTs first   Meds ordered this encounter  Medications  . docusate sodium (COLACE) 50 MG capsule    Sig: Take 1 capsule (50 mg total) by mouth 2 (two) times daily.    Dispense:  10 capsule    Refill:  0    Order Specific Question:   Supervising Provider    Answer:   Quentin AngstJEGEDE, OLUGBEMIGA E [id number]  Follow-up: Return if symptoms worsen or fail to improve.   Loletta Specter PA

## 2017-03-01 ENCOUNTER — Telehealth (INDEPENDENT_AMBULATORY_CARE_PROVIDER_SITE_OTHER): Payer: Self-pay | Admitting: *Deleted

## 2017-03-01 ENCOUNTER — Other Ambulatory Visit (INDEPENDENT_AMBULATORY_CARE_PROVIDER_SITE_OTHER): Payer: Self-pay | Admitting: Physician Assistant

## 2017-03-01 ENCOUNTER — Encounter: Payer: Self-pay | Admitting: Gastroenterology

## 2017-03-01 DIAGNOSIS — E785 Hyperlipidemia, unspecified: Secondary | ICD-10-CM

## 2017-03-01 LAB — CBC WITH DIFFERENTIAL/PLATELET
Basophils Absolute: 0 10*3/uL (ref 0.0–0.2)
Basos: 1 %
EOS (ABSOLUTE): 0.2 10*3/uL (ref 0.0–0.4)
Eos: 2 %
Hematocrit: 47.2 % (ref 37.5–51.0)
Hemoglobin: 15.5 g/dL (ref 13.0–17.7)
IMMATURE GRANS (ABS): 0 10*3/uL (ref 0.0–0.1)
Immature Granulocytes: 0 %
LYMPHS: 31 %
Lymphocytes Absolute: 2 10*3/uL (ref 0.7–3.1)
MCH: 30 pg (ref 26.6–33.0)
MCHC: 32.8 g/dL (ref 31.5–35.7)
MCV: 92 fL (ref 79–97)
Monocytes Absolute: 0.5 10*3/uL (ref 0.1–0.9)
Monocytes: 8 %
NEUTROS ABS: 3.8 10*3/uL (ref 1.4–7.0)
Neutrophils: 58 %
PLATELETS: 163 10*3/uL (ref 150–379)
RBC: 5.16 x10E6/uL (ref 4.14–5.80)
RDW: 14.1 % (ref 12.3–15.4)
WBC: 6.6 10*3/uL (ref 3.4–10.8)

## 2017-03-01 LAB — COMPREHENSIVE METABOLIC PANEL
ALK PHOS: 80 IU/L (ref 39–117)
ALT: 16 IU/L (ref 0–44)
AST: 22 IU/L (ref 0–40)
Albumin/Globulin Ratio: 1.7 (ref 1.2–2.2)
Albumin: 4.6 g/dL (ref 3.5–5.5)
BUN/Creatinine Ratio: 8 — ABNORMAL LOW (ref 9–20)
BUN: 8 mg/dL (ref 6–24)
Bilirubin Total: <0.2 mg/dL (ref 0.0–1.2)
CALCIUM: 9.6 mg/dL (ref 8.7–10.2)
CO2: 24 mmol/L (ref 20–29)
CREATININE: 0.99 mg/dL (ref 0.76–1.27)
Chloride: 101 mmol/L (ref 96–106)
GFR calc Af Amer: 106 mL/min/{1.73_m2} (ref >59)
GFR calc non Af Amer: 92 mL/min/{1.73_m2} (ref >59)
GLUCOSE: 87 mg/dL (ref 65–99)
Globulin, Total: 2.7 g/dL (ref 1.5–4.5)
Potassium: 3.9 mmol/L (ref 3.5–5.2)
Sodium: 142 mmol/L (ref 134–144)
Total Protein: 7.3 g/dL (ref 6.0–8.5)

## 2017-03-01 LAB — LIPID PANEL
CHOL/HDL RATIO: 3.2 ratio (ref 0.0–5.0)
CHOLESTEROL TOTAL: 225 mg/dL — AB (ref 100–199)
HDL: 71 mg/dL (ref >39)
LDL Calculated: 138 mg/dL — ABNORMAL HIGH (ref 0–99)
Triglycerides: 81 mg/dL (ref 0–149)
VLDL Cholesterol Cal: 16 mg/dL (ref 5–40)

## 2017-03-01 LAB — PSA: Prostate Specific Ag, Serum: 1 ng/mL (ref 0.0–4.0)

## 2017-03-01 LAB — HIV ANTIBODY (ROUTINE TESTING W REFLEX): HIV SCREEN 4TH GENERATION: NONREACTIVE

## 2017-03-01 NOTE — Telephone Encounter (Signed)
Patient verified DOB Patient is aware of LDL being elevated and needing to limit the greasy/fatty food intake. Patient is also aware of HIV being negative and prostate being normal. Patient had no further questions regarding labs at this time.

## 2017-03-01 NOTE — Telephone Encounter (Signed)
-----   Message from Loletta Specteroger David Gomez, [name] sent at 03/01/2017  8:38 AM EDT ----- LDL elevated. I recommend abstaining from greasy/fatty foods. No statin for now since he is taking Terbinafine. PSA within normal limits. HIV negative. Rest of labs normal.

## 2017-03-05 ENCOUNTER — Telehealth (INDEPENDENT_AMBULATORY_CARE_PROVIDER_SITE_OTHER): Payer: Self-pay | Admitting: Physician Assistant

## 2017-03-05 ENCOUNTER — Other Ambulatory Visit (INDEPENDENT_AMBULATORY_CARE_PROVIDER_SITE_OTHER): Payer: Self-pay | Admitting: Physician Assistant

## 2017-03-05 DIAGNOSIS — B351 Tinea unguium: Secondary | ICD-10-CM

## 2017-03-05 MED ORDER — TERBINAFINE HCL 250 MG PO TABS: 250.0000 mg | ORAL_TABLET | Freq: Every day | ORAL | 0 refills | Status: DC

## 2017-03-05 NOTE — Telephone Encounter (Signed)
Called patient back and he stated that PCP told him to call and remind office staff to remind him(PCP) to send antifungal medication. Looked through previous notes and saw no mention of Rx fungal med. Please advise or follow up with patient. [name], CMA

## 2017-03-05 NOTE — Telephone Encounter (Signed)
Yes, we spoke about using Terbinafine for his toe nail fungus. I have reviewed his LFTs and he will be fine to take it. I have sent to his [facility] on record. Remind him that he should not drink alcohol while taking terbinafine.

## 2017-03-05 NOTE — Telephone Encounter (Signed)
Patient called back stated would like to talk to nurse today regard labs and medication.  Please follow up with patient.

## 2017-03-05 NOTE — Telephone Encounter (Signed)
Patient left voicemail stated needs to discuss lab result and needs to know if medication was prescribed .  Please follow up with patient

## 2017-03-06 NOTE — Telephone Encounter (Signed)
Left message for patient that he is fine to take antifungal and it has been sent to pharmacy on file, reminded patient to refrain from alcohol while taking this medication and to call office with any questions or concerns. [name], CMA

## 2017-03-22 ENCOUNTER — Other Ambulatory Visit (INDEPENDENT_AMBULATORY_CARE_PROVIDER_SITE_OTHER): Payer: Self-pay | Admitting: Physician Assistant

## 2017-03-22 ENCOUNTER — Telehealth (INDEPENDENT_AMBULATORY_CARE_PROVIDER_SITE_OTHER): Payer: Self-pay | Admitting: Physician Assistant

## 2017-03-22 DIAGNOSIS — B351 Tinea unguium: Secondary | ICD-10-CM

## 2017-03-22 DIAGNOSIS — B354 Tinea corporis: Secondary | ICD-10-CM

## 2017-03-22 MED ORDER — TERBINAFINE HCL 250 MG PO TABS: 250.0000 mg | ORAL_TABLET | Freq: Every day | ORAL | 0 refills | Status: DC

## 2017-03-22 MED FILL — TERBINAFINE HCL 250 MG TAB: 250 | 30 days supply | Qty: 30 | Fill #0

## 2017-03-22 NOTE — Telephone Encounter (Signed)
FWD to PCP. Idelia Caudell S Dalisha Shively, CMA  

## 2017-03-22 NOTE — Telephone Encounter (Signed)
[facility] called [facility] there for him.    Temp[place]t aware will follow up with patient.

## 2017-03-22 NOTE — Telephone Encounter (Signed)
[facility] Jeremy Randall, CMA

## 2017-04-15 ENCOUNTER — Ambulatory Visit: Payer: Self-pay | Admitting: Gastroenterology

## 2017-05-07 ENCOUNTER — Encounter (INDEPENDENT_AMBULATORY_CARE_PROVIDER_SITE_OTHER): Payer: Self-pay | Admitting: Physician Assistant

## 2017-05-07 ENCOUNTER — Ambulatory Visit (INDEPENDENT_AMBULATORY_CARE_PROVIDER_SITE_OTHER): Payer: Self-pay | Admitting: Physician Assistant

## 2017-05-07 VITALS — BP 145/90 | HR 56 | Temp 98.2°F | Wt 178.8 lb

## 2017-05-07 DIAGNOSIS — M542 Cervicalgia: Secondary | ICD-10-CM

## 2017-05-07 DIAGNOSIS — R202 Paresthesia of skin: Secondary | ICD-10-CM

## 2017-05-07 DIAGNOSIS — E559 Vitamin D deficiency, unspecified: Secondary | ICD-10-CM

## 2017-05-07 MED ORDER — KETOROLAC TROMETHAMINE 60 MG/2ML IM SOLN
60.0000 mg | Freq: Once | INTRAMUSCULAR | Status: AC
  Administered 2017-05-07: 60 mg via INTRAMUSCULAR

## 2017-05-07 MED ORDER — VITAMIN D (ERGOCALCIFEROL) 1.25 MG (50000 UNIT) PO CAPS: 50000.0000 [IU] | ORAL_CAPSULE | ORAL | 0 refills | Status: DC

## 2017-05-07 MED ORDER — PREDNISONE 20 MG PO TABS: 60.0000 mg | ORAL_TABLET | Freq: Every day | ORAL | 0 refills | Status: AC

## 2017-05-07 MED FILL — ?PREDNISONE 20MG TABLET: 20 | 5 days supply | Qty: 15 | Fill #0

## 2017-05-07 MED FILL — VIT D2 1.25 MG (50,000 UNIT: 1.25 MG | 27 days supply | Qty: 4 | Fill #0

## 2017-05-07 NOTE — Progress Notes (Signed)
Subjective:  Patient ID: Jeremy Randall, male    DOB: 07-01-1971  Age: 46 y.o. MRN: [id number]  CC: numbness and pain in the left arm  HPI Jeremy Randall is a 46 y.o. male with a PMH of Afib, HLD, Vitamin D deficiency, hemorrhoids, and tinea corporis presents with numbness, tingling and pain in the left arm. Onset approximately 2 weeks ago. Possibly attributed to working out and doing push ups the week prior to onset. Pain described as a numbness as if "you bump your elbow". Location of paresthesia in the left forearm up to the elbow. There have been two episodes where the forearm felt as if "cold rain" was hitting his arm. There is aching/throbbing pain from the elbow to the shoulder. Pain is exacerbated with neck extension. Does not recall any trauma but may have "slept wrong". Denies CP, palpitations, SOB, HA, LE paresthesia, abdominal pain, f/c/n/v, rash, or GI/GU sxs.     Outpatient Medications Prior to Visit  Medication Sig Dispense Refill  . docusate sodium (COLACE) 50 MG capsule Take 1 capsule (50 mg total) by mouth 2 (two) times daily. (Patient not taking: Reported on 05/07/2017) 10 capsule 0  . fluconazole (DIFLUCAN) 150 MG tablet Take 1 tablet (150 mg total) by mouth every 3 (three) days. (Patient not taking: Reported on 05/07/2017) 3 tablet 0  . hydrocortisone (ANUSOL-HC) 25 MG suppository Place 1 suppository (25 mg total) rectally 2 (two) times daily. (Patient not taking: Reported on 05/07/2017) 14 suppository 0  . ibuprofen (ADVIL,MOTRIN) 200 MG tablet Take 600 mg by mouth every 6 (six) hours as needed for pain.    Marland Kitchen terbinafine (LAMISIL) 250 MG tablet Take 1 tablet (250 mg total) by mouth daily. 90 tablet 0   No facility-administered medications prior to visit.      ROS Review of Systems  Constitutional: Negative for chills, fever and malaise/fatigue.  Eyes: Negative for blurred vision.  Respiratory: Negative for shortness of breath.   Cardiovascular: Negative for chest pain and  palpitations.  Gastrointestinal: Negative for abdominal pain and nausea.  Genitourinary: Negative for dysuria and hematuria.  Musculoskeletal: Negative for joint pain and myalgias.  Skin: Negative for rash.  Neurological: Positive for tingling. Negative for headaches.  Psychiatric/Behavioral: Negative for depression. The patient is not nervous/anxious.     Objective:  BP (!) 145/90 (BP Location: Left Arm, Patient Position: Sitting, Cuff Size: Normal)   Pulse (!) 56   Temp 98.2 F (36.8 C) (Oral)   Wt 178 lb 12.8 oz (81.1 kg)   SpO2 100%   BMI 22.96 kg/m   BP/Weight 05/07/2017 02/28/2017 01/28/2017  Systolic BP 145 128 144  Diastolic BP 90 71 92  Wt. (Lbs) 178.8 185 185.8  BMI 22.96 23.75 23.86      Physical Exam  Constitutional: He is oriented to person, place, and time.  Well developed, well nourished, NAD, polite  HENT:  Head: Normocephalic and atraumatic.  Eyes: No scleral icterus.  Cardiovascular: Normal rate, regular rhythm and normal heart sounds.   Pulmonary/Chest: Effort normal and breath sounds normal.  Abdominal: Soft. Bowel sounds are normal. There is no tenderness.  Musculoskeletal: He exhibits no edema.  Mildly increased muscular tonicity of the left upper trapezius  Neurological: He is alert and oriented to person, place, and time. No cranial nerve deficit. Coordination normal.  Mildly impaired light touch sensation of the LUE  Skin: Skin is warm and dry. No rash noted. No erythema. No pallor.  Psychiatric: He has a normal mood  and affect. His behavior is normal. Thought content normal.  Vitals reviewed.    Assessment & Plan:    1. Paresthesia of left arm - Adminstered ketorolac (TORADOL) injection 60 mg; Inject 2 mLs (60 mg total) into the muscle once. - Begin tomorrow predniSONE (DELTASONE) 20 MG tablet; Take 3 tablets (60 mg total) by mouth daily with breakfast.  Dispense: 15 tablet; Refill: 0  2. Cervicalgia - Administered ketorolac (TORADOL)  injection 60 mg; Inject 2 mLs (60 mg total) into the muscle once. - Begin tomorrow predniSONE (DELTASONE) 20 MG tablet; Take 3 tablets (60 mg total) by mouth daily with breakfast.  Dispense: 15 tablet; Refill: 0  3. Vitamin D deficiency - Begin Vitamin D, Ergocalciferol, (DRISDOL) 50000 units CAPS capsule; Take 1 capsule (50,000 Units total) by mouth every 7 (seven) days.  Dispense: 8 capsule; Refill: 0   Meds ordered this encounter  Medications  . ketorolac (TORADOL) injection 60 mg  . predniSONE (DELTASONE) 20 MG tablet    Sig: Take 3 tablets (60 mg total) by mouth daily with breakfast.    Dispense:  15 tablet    Refill:  0    Order Specific Question:   Supervising Provider    Answer:   Quentin Angst [id number]  . Vitamin D, Ergocalciferol, (DRISDOL) 50000 units CAPS capsule    Sig: Take 1 capsule (50,000 Units total) by mouth every 7 (seven) days.    Dispense:  8 capsule    Refill:  0    Order Specific Question:   Supervising Provider    Answer:   Quentin Angst [id number]    Follow-up: Return if symptoms worsen or fail to improve.   Loletta Specter PA

## 2017-05-07 NOTE — Patient Instructions (Signed)

## 2017-05-09 ENCOUNTER — Encounter (HOSPITAL_COMMUNITY): Payer: Self-pay | Admitting: Emergency Medicine

## 2017-05-09 ENCOUNTER — Emergency Department (HOSPITAL_COMMUNITY)
Admission: EM | Admit: 2017-05-09 | Discharge: 2017-05-09 | Disposition: A | Discharge status: Home / Self Care | Payer: Self-pay | Attending: Emergency Medicine | Admitting: Emergency Medicine

## 2017-05-09 DIAGNOSIS — F17210 Nicotine dependence, cigarettes, uncomplicated: Secondary | ICD-10-CM | POA: Insufficient documentation

## 2017-05-09 DIAGNOSIS — R1013 Epigastric pain: Secondary | ICD-10-CM

## 2017-05-09 DIAGNOSIS — K2900 Acute gastritis without bleeding: Secondary | ICD-10-CM | POA: Insufficient documentation

## 2017-05-09 DIAGNOSIS — Z79899 Other long term (current) drug therapy: Secondary | ICD-10-CM | POA: Insufficient documentation

## 2017-05-09 DIAGNOSIS — R112 Nausea with vomiting, unspecified: Secondary | ICD-10-CM

## 2017-05-09 LAB — COMPREHENSIVE METABOLIC PANEL
ALBUMIN: 4.4 g/dL (ref 3.5–5.0)
ALK PHOS: 60 U/L (ref 38–126)
ALT: 13 U/L — ABNORMAL LOW (ref 17–63)
AST: 20 U/L (ref 15–41)
Anion gap: 7 (ref 5–15)
BUN: 9 mg/dL (ref 6–20)
CALCIUM: 9.3 mg/dL (ref 8.9–10.3)
CO2: 28 mmol/L (ref 22–32)
Chloride: 101 mmol/L (ref 101–111)
Creatinine, Ser: 1.03 mg/dL (ref 0.61–1.24)
GFR calc Af Amer: >60 mL/min (ref >60)
Glucose, Bld: 105 mg/dL — ABNORMAL HIGH (ref 65–99)
Potassium: 3.5 mmol/L (ref 3.5–5.1)
Sodium: 136 mmol/L (ref 135–145)
TOTAL PROTEIN: 7.5 g/dL (ref 6.5–8.1)
Total Bilirubin: 0.5 mg/dL (ref 0.3–1.2)

## 2017-05-09 LAB — CBC
HCT: 42.5 % (ref 39.0–52.0)
Hemoglobin: 14.8 g/dL (ref 13.0–17.0)
MCH: 30.5 pg (ref 26.0–34.0)
MCHC: 34.8 g/dL (ref 30.0–36.0)
MCV: 87.6 fL (ref 78.0–100.0)
PLATELETS: 225 10*3/uL (ref 150–400)
RBC: 4.85 MIL/uL (ref 4.22–5.81)
RDW: 13.5 % (ref 11.5–15.5)
WBC: 6.4 10*3/uL (ref 4.0–10.5)

## 2017-05-09 LAB — URINALYSIS, ROUTINE W REFLEX MICROSCOPIC
BACTERIA UA: NONE SEEN
Bilirubin Urine: NEGATIVE
Glucose, UA: NEGATIVE mg/dL
KETONES UR: 20 mg/dL — AB
NITRITE: NEGATIVE
Protein, ur: 30 mg/dL — AB
Specific Gravity, Urine: 1.033 — ABNORMAL HIGH (ref 1.005–1.030)
pH: 5 (ref 5.0–8.0)

## 2017-05-09 LAB — LIPASE, BLOOD: Lipase: 31 U/L (ref 11–51)

## 2017-05-09 MED ORDER — FAMOTIDINE IN NACL 20-0.9 MG/50ML-% IV SOLN
20.0000 mg | Freq: Once | INTRAVENOUS | Status: AC
  Administered 2017-05-09: 20 mg via INTRAVENOUS
  Filled 2017-05-09: qty 50

## 2017-05-09 MED ORDER — SODIUM CHLORIDE 0.9 % IV BOLUS (SEPSIS)
1000.0000 mL | Freq: Once | INTRAVENOUS | Status: AC
Start: 2017-05-09 — End: 2017-05-09
  Administered 2017-05-09: 1000 mL via INTRAVENOUS

## 2017-05-09 MED ORDER — RANITIDINE HCL 150 MG PO TABS: 150.0000 mg | ORAL_TABLET | Freq: Two times a day (BID) | ORAL | 0 refills | Status: DC

## 2017-05-09 MED ORDER — GI COCKTAIL ~~LOC~~
30.0000 mL | Freq: Once | ORAL | Status: AC
Start: 2017-05-09 — End: 2017-05-09
  Administered 2017-05-09: 30 mL via ORAL
  Filled 2017-05-09: qty 30

## 2017-05-09 MED ORDER — ONDANSETRON HCL 4 MG/2ML IJ SOLN
4.0000 mg | Freq: Once | INTRAMUSCULAR | Status: AC
  Administered 2017-05-09: 4 mg via INTRAVENOUS
  Filled 2017-05-09: qty 2

## 2017-05-09 MED ORDER — ONDANSETRON 4 MG PO TBDP: 4.0000 mg | ORAL_TABLET | Freq: Three times a day (TID) | ORAL | 0 refills | Status: DC | PRN

## 2017-05-09 NOTE — ED Provider Notes (Signed)
[facility]-EMERGENCY DEPT Provider Note   CSN: [id number] Arrival date & time: 05/09/17  1105     History   Chief Complaint Chief Complaint  Patient presents with  . Arm Pain    left  . Abdominal Pain    HPI Jeremy Randall is a 46 y.o. male with a PMHx of gastritis, Afib, HLD, Vitamin D deficiency, hemorrhoids, tinea corporis, and tobacco use, with PSHx of appendectomy, who presents to the ED with complaints of generalized abdominal pain, nausea, vomiting, and lack of appetite 2 days after getting a Toradol shot at his PCPs office. Chart review reveals he was seen by his PCP Dr. [name] on 05/07/17 for L arm pain/paresthesias, given 60mg  IM toradol and advised to start prednisone 60mg  daily x5 days beginning 05/08/17. Pt states that he was never able to start the prednisone because the day he received the toradol shot, he started having symptoms including n/v, so he never started it. He describes his pain as 6/10 intermittent twirling/twisting/gnawing nonradiating generalized abdominal pain, which worsens with drinking, and with no treatments tried prior to arrival. He has not had an appetite and has not eaten anything since yesterday. He has had 4 episodes of nonbloody nonbilious emesis. He continues to complain of left arm pain and paresthesias however he has been evaluated for that by his PCP come a denies any acute changes, states he is here for his abdominal pain. He has been taking NSAIDs recently due to his arm pain.  He denies fevers, chills, CP, SOB, diarrhea/constipation, obstipation, melena, hematochezia, hematemesis, hematuria, dysuria, testicular pain/swelling, penile discharge, numbness, focal weakness, or any other complaints at this time. Denies recent travel, sick contacts, suspicious food intake, or EtOH use.    The history is provided by the patient and medical records. No language interpreter was used.  Abdominal Pain   This is a new problem. The current episode started 2 days ago.  The problem occurs hourly. The problem has not changed since onset.Associated with: recent NSAIDs. The pain is located in the epigastric region. Quality: twisting/twirling/gnawing. The pain is at a severity of 6/10. The pain is moderate. Associated symptoms include nausea and vomiting. Pertinent negatives include fever, diarrhea, flatus, hematochezia, melena, constipation, dysuria, hematuria, arthralgias and myalgias. Exacerbated by: drinking. Nothing relieves the symptoms.    History reviewed. No pertinent past medical history.  Patient Active Problem List   Diagnosis Date Noted  . Pain, dental 05/26/2014  . H/O dental abscess 05/26/2014  . Gastritis 05/26/2014  . Smoking 05/26/2014  . Atrial fibrillation (HCC) 05/10/2012  . Abnormal finding on EKG 05/10/2012  . Nausea and vomiting 05/10/2012  . Prolonged Q-T interval on ECG 05/10/2012  . Hypokalemia 05/10/2012    Past Surgical History:  Procedure Laterality Date  . APPENDECTOMY    . PATELLAR TENDON REPAIR         Home Medications    Prior to Admission medications   Medication Sig Start Date End Date Taking? Authorizing Provider  docusate sodium (COLACE) 50 MG capsule Take 1 capsule (50 mg total) by mouth 2 (two) times daily. Patient not taking: Reported on 05/07/2017 02/28/17   Loletta Specter, [name]  fluconazole (DIFLUCAN) 150 MG tablet Take 1 tablet (150 mg total) by mouth every 3 (three) days. Patient not taking: Reported on 05/07/2017 02/28/17   Loletta Specter, [name]  hydrocortisone (ANUSOL-HC) 25 MG suppository Place 1 suppository (25 mg total) rectally 2 (two) times daily. Patient not taking: Reported on 05/07/2017 01/28/17   [name],  [name], [name]  ibuprofen (ADVIL,MOTRIN) 200 MG tablet Take 600 mg by mouth every 6 (six) hours as needed for pain.    [provider]  predniSONE (DELTASONE) 20 MG tablet Take 3 tablets (60 mg total) by mouth daily with breakfast. 05/07/17 05/12/17  Loletta Specter, [name]    terbinafine (LAMISIL) 250 MG tablet Take 1 tablet (250 mg total) by mouth daily. 03/22/17   Loletta Specter, [name]  Vitamin D, Ergocalciferol, (DRISDOL) 50000 units CAPS capsule Take 1 capsule (50,000 Units total) by mouth every 7 (seven) days. 05/07/17   Loletta Specter, [name]    Family History Family History  Problem Relation Age of Onset  . Diabetes Mother   . Hypertension Mother   . Cancer Sister   . Hypertension Sister   . Hypertension Father   . Diabetes Maternal Aunt     Social History Social History  Substance Use Topics  . Smoking status: Current Every Day Smoker    Packs/day: 0.50    Years: 25.00    Types: Cigarettes  . Smokeless tobacco: Never Used  . Alcohol use Yes     Comment: rare     Allergies   Patient has no known allergies.   Review of Systems Review of Systems  Constitutional: Positive for appetite change. Negative for chills and fever.  Respiratory: Negative for shortness of breath.   Cardiovascular: Negative for chest pain.  Gastrointestinal: Positive for abdominal pain, nausea and vomiting. Negative for blood in stool, constipation, diarrhea, flatus, hematochezia and melena.  Genitourinary: Negative for discharge, dysuria, hematuria, scrotal swelling and testicular pain.  Musculoskeletal: Negative for arthralgias and myalgias.  Skin: Negative for color change.  Allergic/Immunologic: Negative for immunocompromised state.  Neurological: Negative for weakness and numbness.  Psychiatric/Behavioral: Negative for confusion.   All other systems reviewed and are negative for acute change except as noted in the HPI.    Physical Exam Updated Vital Signs BP (!) 150/111 (BP Location: Left Arm)   Pulse 68   Temp 98.7 F (37.1 C) (Oral)   Resp 17   SpO2 98%   Physical Exam  Constitutional: He is oriented to person, place, and time. Vital signs are normal. He appears well-developed and well-nourished.  Non-toxic appearance. No distress.  Afebrile,  nontoxic, NAD  HENT:  Head: Normocephalic and atraumatic.  Mouth/Throat: Oropharynx is clear and moist and mucous membranes are normal.  Eyes: Conjunctivae and EOM are normal. Right eye exhibits no discharge. Left eye exhibits no discharge.  Neck: Normal range of motion. Neck supple.  Cardiovascular: Normal rate, regular rhythm, normal heart sounds and intact distal pulses.  Exam reveals no gallop and no friction rub.   No murmur heard. Pulmonary/Chest: Effort normal and breath sounds normal. No respiratory distress. He has no decreased breath sounds. He has no wheezes. He has no rhonchi. He has no rales.  Abdominal: Soft. Normal appearance and bowel sounds are normal. He exhibits no distension. There is tenderness in the epigastric area. There is no rigidity, no rebound, no guarding, no CVA tenderness, no tenderness at McBurney's point and negative Murphy's sign.  Soft, nondistended, +BS throughout, with mild epigastric TTP, no r/g/r, neg murphy's, neg mcburney's, no CVA TTP   Musculoskeletal: Normal range of motion.  Neurological: He is alert and oriented to person, place, and time. He has normal strength. No sensory deficit.  Skin: Skin is warm, dry and intact. No rash noted.  Psychiatric: He has a normal mood and affect.  Nursing note  and vitals reviewed.    ED Treatments / Results  Labs (all labs ordered are listed, but only abnormal results are displayed) Labs Reviewed  COMPREHENSIVE METABOLIC PANEL - Abnormal; Notable for the following:       Result Value   Glucose, Bld 105 (*)    ALT 13 (*)    All other components within normal limits  URINALYSIS, ROUTINE W REFLEX MICROSCOPIC - Abnormal; Notable for the following:    Specific Gravity, Urine 1.033 (*)    Hgb urine dipstick SMALL (*)    Ketones, ur 20 (*)    Protein, ur 30 (*)    Leukocytes, UA TRACE (*)    Squamous Epithelial / LPF 0-5 (*)    All other components within normal limits  LIPASE, BLOOD  CBC    EKG  EKG  Interpretation  Date/Time:  Thursday May 09 2017 17:03:49 EDT Ventricular Rate:  47 PR Interval:    QRS Duration: 107 QT Interval:  465 QTC Calculation: 412 R Axis:   91 Text Interpretation:  Sinus bradycardia Borderline right axis deviation Nonspecific T abnrm, anterolateral leads Since last tracing, sinus rhythm has replaced atrial fibrillation Confirmed by Shaune PollackIsaacs, Cameron [phone]([id number]) on 05/09/2017 5:07:44 PM       Radiology No results found.  Procedures Procedures (including critical care time)  Medications Ordered in ED Medications  sodium chloride 0.9 % bolus 1,000 mL (0 mLs Intravenous Stopped 05/09/17 1905)  ondansetron (ZOFRAN) injection 4 mg (4 mg Intravenous Given 05/09/17 1655)  gi cocktail (Maalox,Lidocaine,Donnatal) (30 mLs Oral Given 05/09/17 1655)  famotidine (PEPCID) IVPB 20 mg premix (0 mg Intravenous Stopped 05/09/17 1725)     Initial Impression / Assessment and Plan / ED Course  I have reviewed the triage vital signs and the nursing notes.  Pertinent labs & imaging results that were available during my care of the patient were reviewed by me and considered in my medical decision making (see chart for details).     46 y.o. male here with abd pain and n/v since getting toradol IM at PCP's office 2 days ago. Had been taking NSAIDs for his arm pain for a few days. Already evaluated for arm, here for abd pain/n/v. On exam, epigastric TTP, nonperitoneal, neg murphy's. CBC, CMP, and lipase unremarkable. Awaiting U/A. Will give zofran, fluids, pepcid, and GI cocktail. Of note, long QT listed in chart, reviewed last EKG from 2013 which had normal QTc. Will get EKG now, but should be fine to receive meds as ordered. Doubt need for imaging. Will await U/A and symptom control, then reassess shortly.   7:37 PM U/A with 0-5 squamous and +mucous so likely contaminated, ketones/protein seen so likely dehydrated as well; no bacteria and no evidence of UTI, doubt urinary etiology of  his symptoms. EKG without prolonged QTc, and actually looks better than prior EKGs; nonischemic, no ongoing Afib, no concerning findings. Pt feeling better and tolerating PO well. Likely gastritis from NSAID use. Advised zantac use, zofran rx given, discussed other OTC remedies for symptomatic relief, stay hydrated, avoid NSAIDs, tylenol use for pain, diet/lifestyle modifications for symptomatic relief, and use of prednisone for his arm pain. F/up with PCP in 5-7 days for ongoing management of his symptoms, and his left arm pain. I explained the diagnosis and have given explicit precautions to return to the ER including for any other new or worsening symptoms. The patient understands and accepts the medical plan as it's been dictated and I have answered their questions. Discharge instructions  concerning home care and prescriptions have been given. The patient is STABLE and is discharged to home in good condition.    Final Clinical Impressions(s) / ED Diagnoses   Final diagnoses:  Acute epigastric pain  Acute gastritis, presence of bleeding unspecified, unspecified gastritis type  Nausea and vomiting in adult patient    New Prescriptions New Prescriptions   ONDANSETRON (ZOFRAN ODT) 4 MG DISINTEGRATING TABLET    Take 1 tablet (4 mg total) by mouth every 8 (eight) hours as needed for nausea or vomiting.   RANITIDINE (ZANTAC) 150 MG TABLET    Take 1 tablet (150 mg total) by mouth 2 (two) times daily.     [address] [place], [place] 05/09/17 1937    Shaune Pollack, MD 05/10/17 [phone]

## 2017-05-09 NOTE — ED Notes (Signed)
Pt given PO fluids.

## 2017-05-09 NOTE — Discharge Instructions (Signed)
Your abdominal pain is likely from gastritis or an ulcer, likely related to your recent NSAID use. You will need to take zantac as directed, and avoid spicy/fatty/acidic foods, avoid soda/coffee/tea/alcohol. Avoid laying down flat within 30 minutes of eating. Avoid NSAIDs like ibuprofen/aleve/motrin/etc on an empty stomach. May consider using over the counter tums/maalox as needed for additional relief. Use zofran as directed as needed for nausea. Use tylenol as needed for pain. Take the prednisone you were prescribed by your regular doctor, as directed by them. Follow up with your regular doctor in 5-7 days for recheck of symptoms. Return to the ER for changes or worsening symptoms.  Abdominal (belly) pain can be caused by many things. Your caregiver performed an examination and possibly ordered blood/urine tests and imaging (CT scan, x-rays, ultrasound). Many cases can be observed and treated at home after initial evaluation in the emergency department. Even though you are being discharged home, abdominal pain can be unpredictable. Therefore, you need a repeated exam if your pain does not resolve, returns, or worsens. Most patients with abdominal pain don't have to be admitted to the hospital or have surgery, but serious problems like appendicitis and gallbladder attacks can start out as nonspecific pain. Many abdominal conditions cannot be diagnosed in one visit, so follow-up evaluations are very important. SEEK IMMEDIATE MEDICAL ATTENTION IF YOU DEVELOP ANY OF THE FOLLOWING SYMPTOMS: The pain does not go away or becomes severe.  A temperature above 101 develops.  Repeated vomiting occurs (multiple episodes).  The pain becomes localized to portions of the abdomen. The right side could possibly be appendicitis. In an adult, the left lower portion of the abdomen could be colitis or diverticulitis.  Blood is being passed in stools or vomit (bright red or black tarry stools).  Return also if you develop  chest pain, difficulty breathing, dizziness or fainting, or become confused, poorly responsive, or inconsolable (young children). The constipation stays for more than 4 days.  There is belly (abdominal) or rectal pain.  You do not seem to be getting better.

## 2017-05-09 NOTE — ED Triage Notes (Signed)
Patient c/o left arm pain that has been hurting for over 2 weeks.  Patient went PCP and got shot of Toradol on Tuesday and been nauseated and having abd pain since then.

## 2017-05-09 NOTE — ED Notes (Signed)
Bed: [facility]ALA Expected date:  Expected time:  Means of arrival:  Comments: 

## 2017-05-09 NOTE — ED Notes (Signed)
Pt ambulatory and independent at discharge.  Verbalized understanding of discharge instructions 

## 2017-05-09 NOTE — ED Notes (Signed)
Bed: WA06 Expected date:  Expected time:  Means of arrival:  Comments: Hold for hallway A

## 2017-05-09 NOTE — ED Notes (Signed)
Pt tolerating PO fluids well. Has drank approximately 6oz of apple juice and reports no N/V

## 2017-05-10 MED FILL — ONDANSETRON ODT 4 MG TABLET: 4 | 5 days supply | Qty: 15 | Fill #0

## 2017-05-10 MED FILL — TERBINAFINE HCL 250 MG TAB: 250 | 30 days supply | Qty: 30 | Fill #1

## 2017-05-10 MED FILL — raNITIdine HCL 150 MG TABS: 150 | 15 days supply | Qty: 30 | Fill #0

## 2017-05-15 ENCOUNTER — Encounter (INDEPENDENT_AMBULATORY_CARE_PROVIDER_SITE_OTHER): Payer: Self-pay | Admitting: Physician Assistant

## 2017-05-15 ENCOUNTER — Ambulatory Visit (INDEPENDENT_AMBULATORY_CARE_PROVIDER_SITE_OTHER): Payer: Self-pay | Admitting: Physician Assistant

## 2017-05-15 VITALS — BP 127/83 | HR 55 | Temp 98.7°F | Resp 18 | Ht 74.0 in | Wt 183.0 lb

## 2017-05-15 DIAGNOSIS — R739 Hyperglycemia, unspecified: Secondary | ICD-10-CM

## 2017-05-15 DIAGNOSIS — M25512 Pain in left shoulder: Secondary | ICD-10-CM

## 2017-05-15 LAB — POCT GLYCOSYLATED HEMOGLOBIN (HGB A1C): HEMOGLOBIN A1C: 6

## 2017-05-15 MED ORDER — GABAPENTIN 100 MG PO CAPS: 100.0000 mg | ORAL_CAPSULE | Freq: Two times a day (BID) | ORAL | 0 refills | Status: DC

## 2017-05-15 MED ORDER — ACETAMINOPHEN 500 MG PO TABS: 1000.0000 mg | ORAL_TABLET | Freq: Three times a day (TID) | ORAL | 0 refills | Status: AC | PRN

## 2017-05-15 NOTE — Progress Notes (Signed)
Subjective:  Patient ID: Jeremy Randall, male    DOB: 02/11/1971  Age: 46 y.o. MRN: [id number]  CC: arm pain  HPI  Jeremy Randall a RHD 46 y.o.malewith a PMH of Afib, HLD, Vitamin D deficiency, hemorrhoids, and tinea corporis presents on f/u of pain, numbness, and tingling of the left arm. Onset approximately 3 weeks ago. Had been possibly attributed to working out and doing push ups the week prior to onset. Had Ketorolac 60 mg administered in clinic on 05/07/17 and prescribed prednisone 60 mg po x 5 days. Pt said medications did not help at all. Still has occasional tingling, numbness, throbbing, and aching. Sometimes sees his triceps "jumps". Has no limitation in aROM, weakness, or paralysis. 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 Denies CP, palpations, SOB, dizziness, HA, back pain, abdominal pain, or rash.    Outpatient Medications Prior to Visit  Medication Sig Dispense Refill  . docusate sodium (COLACE) 50 MG capsule Take 1 capsule (50 mg total) by mouth 2 (two) times daily. 10 capsule 0  . hydrocortisone (ANUSOL-HC) 25 MG suppository Place 1 suppository (25 mg total) rectally 2 (two) times daily. 14 suppository 0  . ibuprofen (ADVIL,MOTRIN) 200 MG tablet Take 600 mg by mouth every 6 (six) hours as needed for pain.    Marland Kitchen. ondansetron (ZOFRAN ODT) 4 MG disintegrating tablet Take 1 tablet (4 mg total) by mouth every 8 (eight) hours as needed for nausea or vomiting. 15 tablet 0  . ranitidine (ZANTAC) 150 MG tablet Take 1 tablet (150 mg total) by mouth 2 (two) times daily. 30 tablet 0  . terbinafine (LAMISIL) 250 MG tablet Take 1 tablet (250 mg total) by mouth daily. (Patient not taking: Reported on 05/09/2017) 90 tablet 0  . Vitamin D, Ergocalciferol, (DRISDOL) 50000 units CAPS capsule Take 1 capsule (50,000 Units total) by mouth every 7 (seven) days. (Patient not taking: Reported on 05/15/2017) 8 capsule 0  . fluconazole (DIFLUCAN) 150 MG tablet Take 1 tablet (150 mg total) by mouth every  3 (three) days. (Patient not taking: Reported on 05/07/2017) 3 tablet 0   No facility-administered medications prior to visit.      ROS Review of Systems  Constitutional: Negative for chills, fever and malaise/fatigue.  Eyes: Negative for blurred vision.  Respiratory: Negative for shortness of breath.   Cardiovascular: Negative for chest pain and palpitations.  Gastrointestinal: Negative for abdominal pain and nausea.  Genitourinary: Negative for dysuria and hematuria.  Musculoskeletal: Positive for joint pain. Negative for myalgias.  Skin: Negative for rash.  Neurological: Positive for tingling. Negative for headaches.  Psychiatric/Behavioral: Negative for depression. The patient is not nervous/anxious.     Objective:  BP 127/83 (BP Location: Left Arm, Patient Position: Sitting, Cuff Size: Normal)   Pulse (!) 55   Temp 98.7 F (37.1 C) (Oral)   Resp 18   Ht 6\' 2"  (1.88 m)   Wt 183 lb (83 kg)   SpO2 100%   BMI 23.50 kg/m   BP/Weight 05/15/2017 05/09/2017 05/07/2017  Systolic BP 127 130 145  Diastolic BP 83 81 90  Wt. (Lbs) 183 - 178.8  BMI 23.5 - 22.96      Physical Exam  Constitutional: He is oriented to person, place, and time.  Well developed, well nourished, NAD, polite  HENT:  Head: Normocephalic and atraumatic.  Eyes: No scleral icterus.  Cardiovascular: Normal rate, regular rhythm and normal heart sounds.   Pulmonary/Chest: Effort normal and breath sounds normal.  Musculoskeletal: He  exhibits no edema.  Neer's test positive on left shoulder.  Neurological: He is alert and oriented to person, place, and time. No cranial nerve deficit. Coordination normal.  Skin: Skin is warm and dry. No rash noted. No erythema. No pallor.  Psychiatric: He has a normal mood and affect. His behavior is normal. Thought content normal.  Vitals reviewed.    Assessment & Plan:   1. Acute pain of left shoulder - Suspected shoulder impingement.  - Physical Therapy referral. -  DG Shoulder Left; Future - Begin acetaminophen (TYLENOL) 500 MG tablet; Take 2 tablets (1,000 mg total) by mouth every 8 (eight) hours as needed.  Dispense: 21 tablet; Refill: 0 - Will have to hold off on NSAID for now due to gastritis.   2. Hyperglycemia - HgB A1c 6.0% in clinic today. - Counseled patient on the need to decrease intake of sweets and carbs. - Recheck in 6-12 months.  Meds ordered this encounter  Medications  . acetaminophen (TYLENOL) 500 MG tablet    Sig: Take 2 tablets (1,000 mg total) by mouth every 8 (eight) hours as needed.    Dispense:  21 tablet    Refill:  0    Order Specific Question:   Supervising Provider    Answer:   Quentin Angst [id number]  . gabapentin (NEURONTIN) 100 MG capsule    Sig: Take 1 capsule (100 mg total) by mouth 2 (two) times daily.    Dispense:  60 capsule    Refill:  0    Order Specific Question:   Supervising Provider    Answer:   Quentin Angst [id number]    Follow-up: Return for shoulder pain.   Loletta Specter PA

## 2017-05-15 NOTE — Patient Instructions (Addendum)
Shoulder Pain Many things can cause shoulder pain, including:  An injury to the area.  Overuse of the shoulder.  Arthritis.  The source of the pain can be:  Inflammation.  An injury to the shoulder joint.  An injury to a tendon, ligament, or bone.  Follow these instructions at home: Take these actions to help with your pain:  Squeeze a soft ball or a foam pad as much as possible. This helps to keep the shoulder from swelling. It also helps to strengthen the arm.  Take over-the-counter and prescription medicines only as told by your health care provider.  If directed, apply ice to the area: ? Put ice in a plastic bag. ? Place a towel between your skin and the bag. ? Leave the ice on for 20 minutes, 2-3 times per day. Stop applying ice if it does not help with the pain.  If you were given a shoulder sling or immobilizer: ? Wear it as told. ? Remove it to shower or bathe. ? Move your arm as little as possible, but keep your hand moving to prevent swelling.  Contact a health care provider if:  Your pain gets worse.  Your pain is not relieved with medicines.  New pain develops in your arm, hand, or fingers. Get help right away if:  Your arm, hand, or fingers: ? Tingle. ? Become numb. ? Become swollen. ? Become painful. ? Turn white or blue. This information is not intended to replace advice given to you by your health care provider. Make sure you discuss any questions you have with your health care provider. Document Released: 06/06/2005 Document Revised: 04/22/2016 Document Reviewed: 12/20/2014 Elsevier Interactive Patient Education  2017 [employer]    Shoulder Impingement Syndrome Shoulder impingement syndrome is a condition that causes pain when connective tissues (tendons) surrounding the shoulder joint become pinched. These tendons are part of the group of muscles and tissues that help to stabilize the shoulder (rotator cuff). Beneath the rotator cuff is a  fluid-filled sac (bursa) that allows the muscles and tendons to glide smoothly. The bursa may become swollen or irritated (bursitis). Bursitis, swelling in the rotator cuff tendons, or both conditions can decrease how much space is under a bone in the shoulder joint (acromion), resulting in impingement. What are the causes? Shoulder impingement syndrome can be caused by bursitis or swelling of the rotator cuff tendons, which may result from:  Repetitive overhead arm movements.  Falling onto the shoulder.  Weakness in the shoulder muscles.  What increases the risk? You may be more likely to develop this condition if you are an athlete who participates in:  Sports that involve throwing, such as baseball.  Tennis.  Swimming.  Volleyball.  Some people are also more likely to develop impingement syndrome because of the shape of their acromion bone. What are the signs or symptoms? The main symptom of this condition is pain on the front or side of the shoulder. Pain may:  Get worse when lifting or raising the arm.  Get worse at night.  Wake you up from sleeping.  Feel sharp when the shoulder is moved, and then fade to an ache.  Other signs and symptoms may include:  Tenderness.  Stiffness.  Inability to raise the arm above shoulder level or behind the body.  Weakness.  How is this diagnosed? This condition may be diagnosed based on:  Your symptoms.  Your medical history.  A physical exam.  Imaging tests, such as: ? X-rays. ?  MRI. ? Ultrasound.  How is this treated? Treatment for this condition may include:  Resting your shoulder and avoiding all activities that cause pain or put stress on the shoulder.  Icing your shoulder.  NSAIDs to help reduce pain and swelling.  One or more injections of medicines to numb the area and reduce inflammation.  Physical therapy.  Surgery. This may be needed if nonsurgical treatments have not helped. Surgery may involve  repairing the rotator cuff, reshaping the acromion, or removing the bursa.  Follow these instructions at home: Managing pain, stiffness, and swelling  If directed, apply ice to the injured area. ? Put ice in a plastic bag. ? Place a towel between your skin and the bag. ? Leave the ice on for 20 minutes, 2-3 times a day. Activity  Rest and return to your normal activities as told by your health care provider. Ask your health care provider what activities are safe for you.  Do exercises as told by your health care provider. General instructions  Do not use any tobacco products, including cigarettes, chewing tobacco, or e-cigarettes. Tobacco can delay healing. If you need help quitting, ask your health care provider.  Ask your health care provider when it is safe for you to drive.  Take over-the-counter and prescription medicines only as told by your health care provider.  Keep all follow-up visits as told by your health care provider. This is important. How is this prevented?  Give your body time to rest between periods of activity.  Be safe and responsible while being active to avoid falls.  Maintain physical fitness, including strength and flexibility. Contact a health care provider if:  Your symptoms have not improved after 1-2 months of treatment and rest.  You cannot lift your arm away from your body. This information is not intended to replace advice given to you by your health care provider. Make sure you discuss any questions you have with your health care provider. Document Released: 08/27/2005 Document Revised: 05/03/2016 Document Reviewed: 07/30/2015 Elsevier Interactive Patient Education  [employer].

## 2017-05-17 ENCOUNTER — Ambulatory Visit (HOSPITAL_COMMUNITY)
Admission: RE | Admit: 2017-05-17 | Discharge: 2017-05-17 | Disposition: A | Discharge status: Home / Self Care | Payer: Self-pay | Source: Ambulatory Visit | Attending: Physician Assistant | Admitting: Physician Assistant

## 2017-05-17 DIAGNOSIS — M25512 Pain in left shoulder: Secondary | ICD-10-CM

## 2018-05-06 ENCOUNTER — Encounter (HOSPITAL_COMMUNITY): Payer: Self-pay

## 2018-05-06 ENCOUNTER — Emergency Department (HOSPITAL_COMMUNITY)
Admission: EM | Admit: 2018-05-06 | Discharge: 2018-05-06 | Disposition: A | Discharge status: Home / Self Care | Payer: Self-pay | Attending: Emergency Medicine | Admitting: Emergency Medicine

## 2018-05-06 ENCOUNTER — Emergency Department (HOSPITAL_COMMUNITY): Payer: Self-pay

## 2018-05-06 ENCOUNTER — Other Ambulatory Visit: Payer: Self-pay

## 2018-05-06 DIAGNOSIS — R110 Nausea: Secondary | ICD-10-CM | POA: Insufficient documentation

## 2018-05-06 DIAGNOSIS — J45909 Unspecified asthma, uncomplicated: Secondary | ICD-10-CM | POA: Insufficient documentation

## 2018-05-06 DIAGNOSIS — Z79899 Other long term (current) drug therapy: Secondary | ICD-10-CM | POA: Insufficient documentation

## 2018-05-06 DIAGNOSIS — R1013 Epigastric pain: Secondary | ICD-10-CM

## 2018-05-06 DIAGNOSIS — F17210 Nicotine dependence, cigarettes, uncomplicated: Secondary | ICD-10-CM | POA: Insufficient documentation

## 2018-05-06 DIAGNOSIS — R0789 Other chest pain: Secondary | ICD-10-CM | POA: Insufficient documentation

## 2018-05-06 HISTORY — DX: Unspecified asthma, uncomplicated: J45.909

## 2018-05-06 LAB — CBC
HEMATOCRIT: 45.5 % (ref 39.0–52.0)
HEMOGLOBIN: 16 g/dL (ref 13.0–17.0)
MCH: 31.5 pg (ref 26.0–34.0)
MCHC: 35.2 g/dL (ref 30.0–36.0)
MCV: 89.6 fL (ref 78.0–100.0)
Platelets: 233 10*3/uL (ref 150–400)
RBC: 5.08 MIL/uL (ref 4.22–5.81)
RDW: 14 % (ref 11.5–15.5)
WBC: 8.1 10*3/uL (ref 4.0–10.5)

## 2018-05-06 LAB — COMPREHENSIVE METABOLIC PANEL
ALBUMIN: 4.5 g/dL (ref 3.5–5.0)
ALT: 12 U/L (ref 0–44)
ANION GAP: 9 (ref 5–15)
AST: 19 U/L (ref 15–41)
Alkaline Phosphatase: 60 U/L (ref 38–126)
BUN: 7 mg/dL (ref 6–20)
CO2: 28 mmol/L (ref 22–32)
Calcium: 9.5 mg/dL (ref 8.9–10.3)
Chloride: 103 mmol/L (ref 98–111)
Creatinine, Ser: 0.91 mg/dL (ref 0.61–1.24)
GFR calc non Af Amer: >60 mL/min (ref >60)
GLUCOSE: 96 mg/dL (ref 70–99)
POTASSIUM: 3.2 mmol/L — AB (ref 3.5–5.1)
SODIUM: 140 mmol/L (ref 135–145)
TOTAL PROTEIN: 8.2 g/dL — AB (ref 6.5–8.1)
Total Bilirubin: 0.8 mg/dL (ref 0.3–1.2)

## 2018-05-06 LAB — URINALYSIS, ROUTINE W REFLEX MICROSCOPIC
BILIRUBIN URINE: NEGATIVE
Glucose, UA: NEGATIVE mg/dL
Hgb urine dipstick: NEGATIVE
Ketones, ur: NEGATIVE mg/dL
LEUKOCYTES UA: NEGATIVE
Nitrite: NEGATIVE
Protein, ur: NEGATIVE mg/dL
SPECIFIC GRAVITY, URINE: 1.015 (ref 1.005–1.030)
pH: 7 (ref 5.0–8.0)

## 2018-05-06 LAB — LIPASE, BLOOD: Lipase: 34 U/L (ref 11–51)

## 2018-05-06 LAB — I-STAT TROPONIN, ED: TROPONIN I, POC: 0.02 ng/mL (ref 0.00–0.08)

## 2018-05-06 MED ORDER — DICYCLOMINE HCL 20 MG PO TABS: 20.0000 mg | ORAL_TABLET | Freq: Two times a day (BID) | ORAL | 0 refills | Status: DC

## 2018-05-06 MED ORDER — POTASSIUM CHLORIDE CRYS ER 20 MEQ PO TBCR
40.0000 meq | EXTENDED_RELEASE_TABLET | Freq: Once | ORAL | Status: AC
  Administered 2018-05-06: 40 meq via ORAL
  Filled 2018-05-06: qty 2

## 2018-05-06 MED ORDER — GI COCKTAIL ~~LOC~~
30.0000 mL | Freq: Once | ORAL | Status: AC
  Administered 2018-05-06: 30 mL via ORAL
  Filled 2018-05-06: qty 30

## 2018-05-06 MED ORDER — SODIUM CHLORIDE 0.9 % IV BOLUS
1000.0000 mL | Freq: Once | INTRAVENOUS | Status: AC
  Administered 2018-05-06: 1000 mL via INTRAVENOUS

## 2018-05-06 MED ORDER — FAMOTIDINE IN NACL 20-0.9 MG/50ML-% IV SOLN
20.0000 mg | Freq: Once | INTRAVENOUS | Status: AC
  Administered 2018-05-06: 20 mg via INTRAVENOUS
  Filled 2018-05-06: qty 50

## 2018-05-06 MED ORDER — ONDANSETRON 4 MG PO TBDP: 4.0000 mg | ORAL_TABLET | Freq: Once | ORAL | Status: DC | PRN

## 2018-05-06 MED ORDER — OMEPRAZOLE 20 MG PO CPDR: 20.0000 mg | DELAYED_RELEASE_CAPSULE | Freq: Two times a day (BID) | ORAL | 0 refills | Status: DC

## 2018-05-06 MED ORDER — ONDANSETRON 4 MG PO TBDP: ORAL_TABLET | ORAL | 0 refills | Status: DC

## 2018-05-06 NOTE — ED Provider Notes (Signed)
[facility]-EMERGENCY DEPT Provider Note   CSN: [id number] Arrival date & time: 05/06/18  1439     History   Chief Complaint Chief Complaint  Patient presents with  . Abdominal Pain  . Nausea  . Back Pain  . Urinary Frequency    HPI Jeremy Randall is a 47 y.o. male.  Jeremy Randall is a 47 y.o. male with history of asthma, A. fib, and gastritis, who presents to the emergency department for evaluation of 2 weeks of epigastric pain that radiates into the chest and mid back.  Patient reports associated nausea but no vomiting, no hematemesis.  No diarrhea, melena, hematochezia or constipation.  No fevers or chills.  Patient reports pain is intermittent, and does seem to be worse after eating.  He reports intermittent chest pains but is not currently experiencing any.  No associated shortness of breath, pain is not worse with exertion, and does not radiate to the arm neck or jaw.  No cough.  No lower extremity swelling or pain.  Patient does report some urinary frequency but no burning or discomfort.  No penile discharge.  Has not tried anything prior to arrival to treat symptoms, he reports intermittent alcohol use and that he does smoke tobacco, has history of gastritis and reflux and ports this does feel similar.   The history is provided by the patient.    Past Medical History:  Diagnosis Date  . Asthma     Patient Active Problem List   Diagnosis Date Noted  . Pain, dental 05/26/2014  . H/O dental abscess 05/26/2014  . Gastritis 05/26/2014  . Smoking 05/26/2014  . Atrial fibrillation (HCC) 05/10/2012  . Abnormal finding on EKG 05/10/2012  . Nausea and vomiting 05/10/2012  . Prolonged Q-T interval on ECG 05/10/2012  . Hypokalemia 05/10/2012    Past Surgical History:  Procedure Laterality Date  . APPENDECTOMY    . PATELLAR TENDON REPAIR          Home Medications    Prior to Admission medications   Medication Sig Start Date End Date Taking?  Authorizing Provider  docusate sodium (COLACE) 50 MG capsule Take 1 capsule (50 mg total) by mouth 2 (two) times daily. 02/28/17   Loletta Specter, [name]  gabapentin (NEURONTIN) 100 MG capsule Take 1 capsule (100 mg total) by mouth 2 (two) times daily. 05/15/17   Loletta Specter, [name]  hydrocortisone (ANUSOL-HC) 25 MG suppository Place 1 suppository (25 mg total) rectally 2 (two) times daily. 01/28/17   Loletta Specter, [name]  ibuprofen (ADVIL,MOTRIN) 200 MG tablet Take 600 mg by mouth every 6 (six) hours as needed for pain.    [provider]  ondansetron (ZOFRAN ODT) 4 MG disintegrating tablet Take 1 tablet (4 mg total) by mouth every 8 (eight) hours as needed for nausea or vomiting. 05/09/17   Street, [place], [name]  ranitidine (ZANTAC) 150 MG tablet Take 1 tablet (150 mg total) by mouth 2 (two) times daily. 05/09/17   Street, [place], [name]    Family History Family History  Problem Relation Age of Onset  . Diabetes Mother   . Hypertension Mother   . Cancer Sister   . Hypertension Sister   . Hypertension Father   . Diabetes Maternal Aunt     Social History Social History   Tobacco Use  . Smoking status: Current Every Day Smoker    Packs/day: 0.50    Years: 25.00    Pack years: 12.50    Types:  Cigarettes  . Smokeless tobacco: Never Used  Substance Use Topics  . Alcohol use: Yes    Comment: rare  . Drug use: Yes    Types: Marijuana    Comment: occ     Allergies   Patient has no known allergies.   Review of Systems Review of Systems  Constitutional: Negative for chills and fever.  HENT: Negative.   Eyes: Negative for visual disturbance.  Respiratory: Negative for cough, chest tightness, shortness of breath and wheezing.   Cardiovascular: Positive for chest pain. Negative for palpitations and leg swelling.  Gastrointestinal: Positive for abdominal pain and nausea. Negative for blood in stool, constipation, diarrhea and vomiting.  Genitourinary: Positive for  frequency. Negative for dysuria.  Musculoskeletal: Negative for arthralgias and myalgias.  Skin: Negative for color change and rash.  Neurological: Negative for dizziness, syncope and light-headedness.     Physical Exam Updated Vital Signs BP (!) 134/92 (BP Location: Right Arm)   Pulse 65   Temp 98.6 F (37 C) (Oral)   Resp 16   Ht 6\' 2"  (1.88 m)   Wt 81.4 kg   SpO2 100%   BMI 23.05 kg/m   Physical Exam  Constitutional: He appears well-developed and well-nourished. He does not appear ill. No distress.  HENT:  Head: Normocephalic and atraumatic.  Mouth/Throat: Oropharynx is clear and moist.  Eyes: Pupils are equal, round, and reactive to light. EOM are normal. Right eye exhibits no discharge. Left eye exhibits no discharge.  Cardiovascular: Normal rate, regular rhythm, normal heart sounds and intact distal pulses. Exam reveals no gallop and no friction rub.  No murmur heard. Pulmonary/Chest: Effort normal and breath sounds normal. No respiratory distress.  Respirations equal and unlabored, patient able to speak in full sentences, lungs clear to auscultation bilaterally  Abdominal: Soft. Normal appearance and bowel sounds are normal. He exhibits no distension. There is tenderness in the epigastric area. There is no rigidity, no rebound and no guarding.  Abdomen soft, nondistended, minimal tenderness in the epigastrium without guarding, all other quadrants nontender to palpation, no peritoneal signs  Neurological: He is alert. Coordination normal.  Skin: Skin is warm and dry. Capillary refill takes less than 2 seconds. He is not diaphoretic.  Psychiatric: He has a normal mood and affect. His behavior is normal.  Nursing note and vitals reviewed.    ED Treatments / Results  Labs (all labs ordered are listed, but only abnormal results are displayed) Labs Reviewed  COMPREHENSIVE METABOLIC PANEL - Abnormal; Notable for the following components:      Result Value   Potassium 3.2  (*)    Total Protein 8.2 (*)    All other components within normal limits  LIPASE, BLOOD  CBC  URINALYSIS, ROUTINE W REFLEX MICROSCOPIC  I-STAT TROPONIN, ED    EKG EKG Interpretation  Date/Time:  Tuesday May 06 2018 15:49:37 EDT Ventricular Rate:  54 PR Interval:    QRS Duration: 105 QT Interval:  430 QTC Calculation: 408 R Axis:   92 Text Interpretation:  Sinus rhythm Borderline right axis deviation ST elev, probable normal early repol pattern Confirmed by Tilden Fossa [phone]), editor Sheppard Evens ([id number]) on 05/06/2018 3:53:39 PM   Radiology Dg Abdomen Acute W/chest  Result Date: 05/06/2018 CLINICAL DATA:  Abdominal discomfort radiating to the chest. "Gnawing" pain for 2 weeks with nausea. Intermittent mid back pain. EXAM: DG ABDOMEN ACUTE W/ 1V CHEST COMPARISON:  Chest radiographs 05/11/2012 FINDINGS: The cardiomediastinal silhouette is within normal limits. The lungs are  well inflated and clear. There is no evidence of pleural effusion or pneumothorax. No acute osseous abnormality is identified. Gas is present in nondilated loops of small and large bowel without evidence of obstruction. No intraperitoneal free air is identified. A small calcification in the left lower pelvis may represent a phlebolith. IMPRESSION: Negative abdominal radiographs.  No acute cardiopulmonary disease. Electronically Signed   By: Sebastian AcheAllen  Grady M.D.   On: 05/06/2018 16:45    Procedures Procedures (including critical care time)  Medications Ordered in ED Medications  sodium chloride 0.9 % bolus 1,000 mL (0 mLs Intravenous Stopped 05/06/18 1808)  famotidine (PEPCID) IVPB 20 mg premix (0 mg Intravenous Stopped 05/06/18 1707)  gi cocktail (Maalox,Lidocaine,Donnatal) (30 mLs Oral Given 05/06/18 1608)  potassium chloride SA (K-DUR,KLOR-CON) CR tablet 40 mEq (40 mEq Oral Given 05/06/18 1827)     Initial Impression / Assessment and Plan / ED Course  I have reviewed the triage vital signs and the  nursing notes.  Pertinent labs & imaging results that were available during my care of the patient were reviewed by me and considered in my medical decision making (see chart for details).  Presents for evaluation of 2 weeks of intermittent chest discomfort and epigastric pain with associated nausea.  History of gastritis and GERD and not currently on any medications for this.  Chest pain is not likely of cardiac or pulmonary etiology d/t presentation, PERC negative, VSS, no tracheal deviation, no JVD or new murmur, RRR, breath sounds equal bilaterally, EKG without acute abnormalities, negative troponin, and negative CXR.  Abdominal films show normal bowel gas pattern and no free air.  No acute electrolyte derangements, normal renal and liver function and normal lipase.  No leukocytosis and normal hemoglobin.  Urinary frequency patient reports but no evidence of UTI on urinalysis.  I suspect that symptoms may be due to GERD, with symptomatic treatment here in the emergency department patient has had a significant improvement and is tolerating p.o.  Will start patient on PPI, with Zofran as needed for nausea and Bentyl as needed for spasm and discomfort.  Patient is to be discharged with recommendation to follow up with PCP and GI in regards to today's hospital visit. Pt has been advised to return to the ED if CP becomes exertional, associated with diaphoresis or nausea, radiates to left jaw/arm, worsens or becomes concerning in any way. Pt appears reliable for follow up and is agreeable to discharge.    Final Clinical Impressions(s) / ED Diagnoses   Final diagnoses:  Epigastric pain  Atypical chest pain  Nausea    ED Discharge Orders         Ordered    omeprazole (PRILOSEC) 20 MG capsule  2 times daily before meals     05/06/18 1911    ondansetron (ZOFRAN ODT) 4 MG disintegrating tablet     05/06/18 1911    dicyclomine (BENTYL) 20 MG tablet  2 times daily     05/06/18 1911           Legrand RamsFord,  Kelsey N, [name] 05/07/18 1433    Little, Ambrose Finlandachel Morgan, MD 05/10/18 [phone]

## 2018-05-06 NOTE — ED Notes (Signed)
Pt provided cup of water with  PO meds

## 2018-05-06 NOTE — ED Triage Notes (Signed)
Patient c/o a "gnawing pain" x 2 weeks with nausea. Patient also c/o intermittent mid back pain that radiates into the chest and urinary frequency.

## 2018-05-06 NOTE — Discharge Instructions (Signed)
Evaluation today is reassuring and does not suggest an acute problem with your heart or lungs causing her symptoms.  I do think this could be due to irritation of your stomach lining and reflux.  Please begin taking omeprazole twice daily 30 minutes before breakfast and dinner, you may use Zofran as needed for nausea and Bentyl as needed for pain.  You may take Maalox for breakthrough symptoms.  Please follow-up with your primary care doctor as well as Gi.  Return to the emergency department for worsening pain, fevers, blood in your vomit or stools, chest pain is worse with exertion or shortness of breath or any other new or concerning symptoms.

## 2018-05-09 MED FILL — ONDANSETRON ODT 4 MG TABLET: 4 | 2 days supply | Qty: 8 | Fill #0

## 2018-05-09 MED FILL — DICYCLOMINE HCL 20 MG TABS: 20 | 10 days supply | Qty: 20 | Fill #0

## 2018-05-09 MED FILL — OMEPRAZOLE 20 MG CAP: 20 | 20 days supply | Qty: 40 | Fill #0

## 2019-06-10 ENCOUNTER — Ambulatory Visit (HOSPITAL_COMMUNITY)
Admission: EM | Admit: 2019-06-10 | Discharge: 2019-06-10 | Disposition: A | Discharge status: Home / Self Care | Payer: Commercial Managed Care - PPO | Attending: Family Medicine | Admitting: Family Medicine

## 2019-06-10 ENCOUNTER — Encounter (HOSPITAL_COMMUNITY): Payer: Self-pay

## 2019-06-10 ENCOUNTER — Other Ambulatory Visit: Payer: Self-pay

## 2019-06-10 DIAGNOSIS — R300 Dysuria: Secondary | ICD-10-CM | POA: Diagnosis present

## 2019-06-10 DIAGNOSIS — Z711 Person with feared health complaint in whom no diagnosis is made: Secondary | ICD-10-CM | POA: Diagnosis present

## 2019-06-10 DIAGNOSIS — R369 Urethral discharge, unspecified: Secondary | ICD-10-CM | POA: Diagnosis not present

## 2019-06-10 LAB — POCT URINALYSIS DIP (DEVICE)
Bilirubin Urine: NEGATIVE
Glucose, UA: NEGATIVE mg/dL
Ketones, ur: NEGATIVE mg/dL
Leukocytes,Ua: NEGATIVE
Nitrite: NEGATIVE
Protein, ur: NEGATIVE mg/dL
Specific Gravity, Urine: >=1.03 (ref 1.005–1.030)
Urobilinogen, UA: 0.2 mg/dL (ref 0.0–1.0)
pH: 6 (ref 5.0–8.0)

## 2019-06-10 MED ORDER — CEFTRIAXONE SODIUM 250 MG IJ SOLR
INTRAMUSCULAR | Status: AC
  Filled 2019-06-10: qty 250

## 2019-06-10 MED ORDER — LIDOCAINE HCL (PF) 1 % IJ SOLN
INTRAMUSCULAR | Status: AC
  Filled 2019-06-10: qty 2

## 2019-06-10 MED ORDER — DOXYCYCLINE HYCLATE 100 MG PO CAPS: 100.0000 mg | ORAL_CAPSULE | Freq: Two times a day (BID) | ORAL | 0 refills | Status: DC

## 2019-06-10 MED ORDER — CEFTRIAXONE SODIUM 250 MG IJ SOLR
250.0000 mg | Freq: Once | INTRAMUSCULAR | Status: AC
  Administered 2019-06-10: 250 mg via INTRAMUSCULAR

## 2019-06-10 NOTE — Discharge Instructions (Signed)
We will treat you for gonorrhea and chlamydia.  I have sent the additional course of antibiotics to the pharmacy, please start this. If your testing returns negative and your symptoms have resolved you may discontinue this.  Will notify of any positive findings and if any changes to treatment are needed.   Please withhold from intercourse for the next week. Please use condoms to prevent STD's.

## 2019-06-10 NOTE — ED Provider Notes (Signed)
[facility]    CSN: [id number] Arrival date & time: 06/10/19  0900      History   Chief Complaint Chief Complaint  Patient presents with  . STD Testing    HPI Jeremy Randall is a 48 y.o. male.   Jeremy Randall presents with complaints of burning with urination as well as white penile discharge which he noted approximately 1 week ago. No urinary frequency. Denies any penile or scrotal redness, swelling, sores or lesions. He has two partners, doesn't use condoms. No specific known std exposure. Has had std in the past, uncertain if this feels similar. Feels bloated but no pelvic or back pain. No fevers. History  Of prolonged q-t, afib, gastritis.    ROS per HPI, negative if not otherwise mentioned.      Past Medical History:  Diagnosis Date  . Asthma     Patient Active Problem List   Diagnosis Date Noted  . Pain, dental 05/26/2014  . H/O dental abscess 05/26/2014  . Gastritis 05/26/2014  . Smoking 05/26/2014  . Atrial fibrillation ([place]) 05/10/2012  . Abnormal finding on EKG 05/10/2012  . Nausea and vomiting 05/10/2012  . Prolonged Q-T interval on ECG 05/10/2012  . Hypokalemia 05/10/2012    Past Surgical History:  Procedure Laterality Date  . APPENDECTOMY    . PATELLAR TENDON REPAIR         Home Medications    Prior to Admission medications   Medication Sig Start Date End Date Taking? Authorizing Provider  dicyclomine (BENTYL) 20 MG tablet Take 1 tablet (20 mg total) by mouth 2 (two) times daily. 05/06/18   Jacqlyn Larsen, [name]  docusate sodium (COLACE) 50 MG capsule Take 1 capsule (50 mg total) by mouth 2 (two) times daily. Patient not taking: Reported on 05/06/2018 02/28/17   Clent Demark, [name]  doxycycline (VIBRAMYCIN) 100 MG capsule Take 1 capsule (100 mg total) by mouth 2 (two) times daily. 06/10/19   Zigmund Gottron, NP  gabapentin (NEURONTIN) 100 MG capsule Take 1 capsule (100 mg total) by mouth 2 (two) times daily. Patient not taking:  Reported on 05/06/2018 05/15/17   Clent Demark, [name]  hydrocortisone (ANUSOL-HC) 25 MG suppository Place 1 suppository (25 mg total) rectally 2 (two) times daily. Patient not taking: Reported on 05/06/2018 01/28/17   Clent Demark, [name]  omeprazole (PRILOSEC) 20 MG capsule Take 1 capsule (20 mg total) by mouth 2 (two) times daily before a meal. 05/06/18   Jacqlyn Larsen, [name]  ondansetron (ZOFRAN ODT) 4 MG disintegrating tablet 4mg  ODT q4 hours prn nausea/vomit 05/06/18   Jacqlyn Larsen, [name]  ranitidine (ZANTAC) 150 MG tablet Take 1 tablet (150 mg total) by mouth 2 (two) times daily. Patient not taking: Reported on 05/06/2018 05/09/17   Street, [place], [name]    Family History Family History  Problem Relation Age of Onset  . Diabetes Mother   . Hypertension Mother   . Cancer Sister   . Hypertension Sister   . Hypertension Father   . Diabetes Maternal Aunt     Social History Social History   Tobacco Use  . Smoking status: Current Every Day Smoker    Packs/day: 0.50    Years: 25.00    Pack years: 12.50    Types: Cigarettes  . Smokeless tobacco: Never Used  Substance Use Topics  . Alcohol use: Yes    Comment: rare  . Drug use: Yes    Types: Marijuana    Comment:  occ     Allergies   Patient has no known allergies.   Review of Systems Review of Systems   Physical Exam Triage Vital Signs ED Triage Vitals  Enc Vitals Group     BP 06/10/19 0920 127/86     Pulse Rate 06/10/19 0920 (!) 58     Resp 06/10/19 0920 17     Temp 06/10/19 0920 99.7 F (37.6 C)     Temp Source 06/10/19 0920 Oral     SpO2 06/10/19 0920 100 %     Weight --      Height --      Head Circumference --      Peak Flow --      Pain Score 06/10/19 0921 6     Pain Loc --      Pain Edu? --      Excl. in GC? --    No data found.  Updated Vital Signs BP 127/86 (BP Location: Right Arm)   Pulse (!) 58   Temp 99.7 F (37.6 C) (Oral)   Resp 17   SpO2 100%    Physical Exam Constitutional:       Appearance: He is well-developed.  Cardiovascular:     Rate and Rhythm: Normal rate.  Pulmonary:     Effort: Pulmonary effort is normal.  Abdominal:     Palpations: Abdomen is soft. Abdomen is not rigid.     Tenderness: There is no abdominal tenderness. There is no guarding or rebound. Negative signs include Murphy's sign and McBurney's sign.     Comments: Denies scrotal redness, swelling, pain; denies sores or lesions; gu exam deferred   Skin:    General: Skin is warm and dry.  Neurological:     Mental Status: He is alert and oriented to person, place, and time.      UC Treatments / Results  Labs (all labs ordered are listed, but only abnormal results are displayed) Labs Reviewed  POCT URINALYSIS DIP (DEVICE) - Abnormal; Notable for the following components:      Result Value   Hgb urine dipstick TRACE (*)    All other components within normal limits  CYTOLOGY, (ORAL, ANAL, URETHRAL) ANCILLARY ONLY    EKG   Radiology No results found.  Procedures Procedures (including critical care time)  Medications Ordered in UC Medications  cefTRIAXone (ROCEPHIN) injection 250 mg (250 mg Intramuscular Given 06/10/19 0956)  cefTRIAXone (ROCEPHIN) 250 MG injection (has no administration in time range)  lidocaine (PF) (XYLOCAINE) 1 % injection (has no administration in time range)    Initial Impression / Assessment and Plan / UC Course  I have reviewed the triage vital signs and the nursing notes.  Pertinent labs & imaging results that were available during my care of the patient were reviewed by me and considered in my medical decision making (see chart for details).     Prolonged q-t so deferred azithromycin today. Opted to provided empiric treatment however as does have multiple partners, no condoms, and symptomatic of likely std. Rocephin and doxy initiated. Doxy may be discontinued if testing is completely negative. Safe sex encouraged. If symptoms worsen or do not  improve in the next week to return to be seen or to follow up with PCP.  Patient verbalized understanding and agreeable to plan.   Final Clinical Impressions(s) / UC Diagnoses   Final diagnoses:  Penile discharge  Dysuria  Concern about STD in male without diagnosis     Discharge Instructions  We will treat you for gonorrhea and chlamydia.  I have sent the additional course of antibiotics to the pharmacy, please start this. If your testing returns negative and your symptoms have resolved you may discontinue this.  Will notify of any positive findings and if any changes to treatment are needed.   Please withhold from intercourse for the next week. Please use condoms to prevent STD's.     ED Prescriptions    Medication Sig Dispense Auth. Provider   doxycycline (VIBRAMYCIN) 100 MG capsule Take 1 capsule (100 mg total) by mouth 2 (two) times daily. 20 capsule Georgetta HaberBurky,  B, NP     PDMP not reviewed this encounter.   Georgetta HaberBurky,  B, NP 06/10/19 1010

## 2019-06-10 NOTE — ED Triage Notes (Signed)
Pt presents with penile discharge and burning with urination.  Pt states he has had a urinary tract infection before.

## 2019-06-11 LAB — CYTOLOGY, (ORAL, ANAL, URETHRAL) ANCILLARY ONLY
Chlamydia: NEGATIVE
Molecular Disclaimer: NEGATIVE
Molecular Disclaimer: NEGATIVE
Molecular Disclaimer: NORMAL
Neisseria Gonorrhea: NEGATIVE
Trichomonas: POSITIVE — AB

## 2019-06-12 ENCOUNTER — Telehealth (HOSPITAL_COMMUNITY): Payer: Self-pay | Admitting: Emergency Medicine

## 2019-06-12 MED ORDER — METRONIDAZOLE 500 MG PO TABS: 2000.0000 mg | ORAL_TABLET | Freq: Once | ORAL | 0 refills | Status: AC

## 2019-06-12 NOTE — Telephone Encounter (Signed)
Trichomonas is positive. Rx  for Flagyl 2 grams, once was sent to the pharmacy of record. Pt needs education to refrain from sexual intercourse for 7 days to give the medicine time to work. Sexual partners need to be notified and tested/treated. Condoms may reduce risk of reinfection. Recheck for further evaluation if symptoms are not improving.   Patient contacted and made aware of    results, all questions answered    

## 2019-06-22 ENCOUNTER — Other Ambulatory Visit: Payer: Self-pay

## 2019-06-22 ENCOUNTER — Encounter (INDEPENDENT_AMBULATORY_CARE_PROVIDER_SITE_OTHER): Payer: Self-pay | Admitting: Primary Care

## 2019-06-22 ENCOUNTER — Ambulatory Visit (INDEPENDENT_AMBULATORY_CARE_PROVIDER_SITE_OTHER): Payer: Commercial Managed Care - PPO | Admitting: Primary Care

## 2019-06-22 VITALS — BP 125/81 | HR 64 | Temp 97.5°F | Ht 74.0 in | Wt 206.6 lb

## 2019-06-22 DIAGNOSIS — H6123 Impacted cerumen, bilateral: Secondary | ICD-10-CM | POA: Diagnosis not present

## 2019-06-22 DIAGNOSIS — F5221 Male erectile disorder: Secondary | ICD-10-CM

## 2019-06-22 DIAGNOSIS — F17200 Nicotine dependence, unspecified, uncomplicated: Secondary | ICD-10-CM

## 2019-06-22 DIAGNOSIS — R351 Nocturia: Secondary | ICD-10-CM

## 2019-06-22 DIAGNOSIS — Z13220 Encounter for screening for lipoid disorders: Secondary | ICD-10-CM

## 2019-06-22 DIAGNOSIS — Z0000 Encounter for general adult medical examination without abnormal findings: Secondary | ICD-10-CM

## 2019-06-22 DIAGNOSIS — B351 Tinea unguium: Secondary | ICD-10-CM | POA: Diagnosis not present

## 2019-06-22 MED ORDER — SILDENAFIL CITRATE 100 MG PO TABS: 50.0000 mg | ORAL_TABLET | Freq: Every day | ORAL | 3 refills | Status: DC | PRN

## 2019-06-22 MED FILL — SILDENAFIL CITRATE 100 MG T: 100 | 15 days supply | Qty: 5 | Fill #0

## 2019-06-22 NOTE — Progress Notes (Signed)
Established Patient Office Visit  Subjective:  Patient ID: Jeremy Randall, male    DOB: 05-04-1971  Age: 48 y.o. MRN: [id number]  CC:  Chief Complaint  Patient presents with  . Establish Care    rash    HPI Jeremy Randall presents for establishment of care with new provider but is establish with [facility]. He main concern is he was not able to have and erection. No other complaints pr concerns voiced.  Past Medical History:  Diagnosis Date  . Asthma     Past Surgical History:  Procedure Laterality Date  . APPENDECTOMY    . PATELLAR TENDON REPAIR      Family History  Problem Relation Age of Onset  . Diabetes Mother   . Hypertension Mother   . Cancer Sister   . Hypertension Sister   . Hypertension Father   . Diabetes Maternal Aunt     Social History   Socioeconomic History  . Marital status: Significant Other    Spouse name: Not on file  . Number of children: Not on file  . Years of education: Not on file  . Highest education level: Not on file  Occupational History  . Not on file  Social Needs  . Financial resource strain: Not on file  . Food insecurity    Worry: Not on file    Inability: Not on file  . Transportation needs    Medical: Not on file    Non-medical: Not on file  Tobacco Use  . Smoking status: Current Every Day Smoker    Packs/day: 0.50    Years: 25.00    Pack years: 12.50    Types: Cigarettes  . Smokeless tobacco: Never Used  Substance and Sexual Activity  . Alcohol use: Yes    Comment: rare  . Drug use: Yes    Types: Marijuana    Comment: occ  . Sexual activity: Not on file  Lifestyle  . Physical activity    Days per week: Not on file    Minutes per session: Not on file  . Stress: Not on file  Relationships  . Social Herbalist on phone: Not on file    Gets together: Not on file    Attends religious service: Not on file    Active member of club or organization: Not on file    Attends meetings of clubs or organizations: Not on  file    Relationship status: Not on file  . Intimate partner violence    Fear of current or ex partner: Not on file    Emotionally abused: Not on file    Physically abused: Not on file    Forced sexual activity: Not on file  Other Topics Concern  . Not on file  Social History Narrative  . Not on file    Outpatient Medications Prior to Visit  Medication Sig Dispense Refill  . doxycycline (VIBRAMYCIN) 100 MG capsule Take 1 capsule (100 mg total) by mouth 2 (two) times daily. 20 capsule 0  . dicyclomine (BENTYL) 20 MG tablet Take 1 tablet (20 mg total) by mouth 2 (two) times daily. 20 tablet 0  . docusate sodium (COLACE) 50 MG capsule Take 1 capsule (50 mg total) by mouth 2 (two) times daily. (Patient not taking: Reported on 05/06/2018) 10 capsule 0  . gabapentin (NEURONTIN) 100 MG capsule Take 1 capsule (100 mg total) by mouth 2 (two) times daily. (Patient not taking: Reported on 05/06/2018) 60 capsule 0  .  hydrocortisone (ANUSOL-HC) 25 MG suppository Place 1 suppository (25 mg total) rectally 2 (two) times daily. (Patient not taking: Reported on 05/06/2018) 14 suppository 0  . omeprazole (PRILOSEC) 20 MG capsule Take 1 capsule (20 mg total) by mouth 2 (two) times daily before a meal. 40 capsule 0  . ondansetron (ZOFRAN ODT) 4 MG disintegrating tablet 75m ODT q4 hours prn nausea/vomit 8 tablet 0  . ranitidine (ZANTAC) 150 MG tablet Take 1 tablet (150 mg total) by mouth 2 (two) times daily. (Patient not taking: Reported on 05/06/2018) 30 tablet 0   No facility-administered medications prior to visit.     No Known Allergies  ROS Review of Systems  Genitourinary: Positive for frequency.  All other systems reviewed and are negative.     Objective:    Physical Exam  Constitutional: He is oriented to person, place, and time. He appears well-developed and well-nourished.  HENT:  Head: Normocephalic.  Bilateral cerumen impaction.  Eyes: Pupils are equal, round, and reactive to light. EOM  are normal.  Neck: Normal range of motion. Neck supple.  Cardiovascular: Normal rate and regular rhythm.  Pulmonary/Chest: Effort normal and breath sounds normal.  Abdominal: Soft. Bowel sounds are normal.  Musculoskeletal: Normal range of motion.  Neurological: He is oriented to person, place, and time.  Skin: Skin is warm.  Psychiatric: He has a normal mood and affect.    BP 125/81 (BP Location: Left Arm, Patient Position: Sitting, Cuff Size: Normal)   Pulse 64   Temp (!) 97.5 F (36.4 C) (Temporal)   Ht _0  (1.88 m)   Wt 206 lb 9.6 oz (93.7 kg)   SpO2 94%   BMI 26.53 kg/m  Wt Readings from Last 3 Encounters:  06/22/19 206 lb 9.6 oz (93.7 kg)  05/06/18 179 lb 8 oz (81.4 kg)  05/15/17 183 lb (83 kg)     There are no preventive care reminders to display for this patient.  There are no preventive care reminders to display for this patient.  Lab Results  Component Value Date   TSH 0.604 05/26/2014   Lab Results  Component Value Date   WBC 8.1 05/06/2018   HGB 16.0 05/06/2018   HCT 45.5 05/06/2018   MCV 89.6 05/06/2018   PLT 233 05/06/2018   Lab Results  Component Value Date   NA 140 05/06/2018   K 3.2 (L) 05/06/2018   CO2 28 05/06/2018   GLUCOSE 96 05/06/2018   BUN 7 05/06/2018   CREATININE 0.91 05/06/2018   BILITOT 0.8 05/06/2018   ALKPHOS 60 05/06/2018   AST 19 05/06/2018   ALT 12 05/06/2018   PROT 8.2 (H) 05/06/2018   ALBUMIN 4.5 05/06/2018   CALCIUM 9.5 05/06/2018   ANIONGAP 9 05/06/2018   Lab Results  Component Value Date   CHOL 225 (H) 02/28/2017   Lab Results  Component Value Date   HDL 71 02/28/2017   Lab Results  Component Value Date   LDLCALC 138 (H) 02/28/2017   Lab Results  Component Value Date   TRIG 81 02/28/2017   Lab Results  Component Value Date   CHOLHDL 3.2 02/28/2017   Lab Results  Component Value Date   HGBA1C 6.0 05/15/2017      Assessment & Plan:  AMarthawas seen today for establish care.  Diagnoses and all  orders for this visit:  Nocturia BPH may cause urination 3-4 times at night will monitor -     PSA -     CMP14+EGFR;  Future   Tinea of nail -     Ambulatory referral to Podiatry  Cerumen debris on tympanic membrane of both ears Ear lavage completed   Smoker Each visit discuss cesation. He is considering stopping we discussed nicoderm patches. Screening cholesterol level -     Lipid Panel  Healthcare maintenance -     CBC with Differential -     CMP14+EGFR; Future -     CMP14+EGFR  Erectile disorder, acquired generalized moderate This is a new problem. Additional work-up:no. We discussed different medications for ED including Viagra, Levitra, and Cialis. We also discussed the risks of such medications, including priapism, monocular vision loss, and hypotension with nitrates. All questions were answered. The patient opted for:  _0   Trial of Viagra; no contraindications. _1   Trial of Cialis; no contraindications. _2   Trial of Levitra; no contraindications. _3   Referral to Urology for further evaluation.  Other orders -     sildenafil (VIAGRA) 100 MG tablet; Take 0.5-1 tablets (50-100 mg total) by mouth daily as needed for erectile dysfunction.    Meds ordered this encounter  Medications  . sildenafil (VIAGRA) 100 MG tablet    Sig: Take 0.5-1 tablets (50-100 mg total) by mouth daily as needed for erectile dysfunction.    Dispense:  5 tablet    Refill:  3    Follow-up: Return if symptoms worsen or fail to improve.    Kerin Perna, NP

## 2019-06-22 NOTE — Progress Notes (Signed)
Pt would like prescription for Viagra

## 2019-06-22 NOTE — Patient Instructions (Addendum)
Erectile Dysfunction Erectile dysfunction (ED) is the inability to get or keep an erection in order to have sexual intercourse. Erectile dysfunction may include:  Inability to get an erection.  Lack of enough hardness of the erection to allow penetration.  Loss of the erection before sex is finished. What are the causes? This condition may be caused by:  Certain medicines, such as: ? Pain relievers. ? Antihistamines. ? Antidepressants. ? Blood pressure medicines. ? Water pills (diuretics). ? Ulcer medicines. ? Muscle relaxants. ? Drugs.  Excessive drinking.  Psychological causes, such as: ? Anxiety. ? Depression. ? Sadness. ? Exhaustion. ? Performance fear. ? Stress.  Physical causes, such as: ? Artery problems. This may include diabetes, smoking, liver disease, or atherosclerosis. ? High blood pressure. ? Hormonal problems, such as low testosterone. ? Obesity. ? Nerve problems. This may include back or pelvic injuries, diabetes mellitus, multiple sclerosis, or Parkinson disease. What are the signs or symptoms? Symptoms of this condition include:  Inability to get an erection.  Lack of enough hardness of the erection to allow penetration.  Loss of the erection before sex is finished.  Normal erections at some times, but with frequent unsatisfactory episodes.  Low sexual satisfaction in either partner due to erection problems.  A curved penis occurring with erection. The curve may cause pain or the penis may be too curved to allow for intercourse.  Never having nighttime erections. How is this diagnosed? This condition is often diagnosed by:  Performing a physical exam to find other diseases or specific problems with the penis.  Asking you detailed questions about the problem.  Performing blood tests to check for diabetes mellitus or to measure hormone levels.  Performing other tests to check for underlying health conditions.  Performing an ultrasound  exam to check for scarring.  Performing a test to check blood flow to the penis.  Doing a sleep study at home to measure nighttime erections. How is this treated? This condition may be treated by:  Medicine taken by mouth to help you achieve an erection (oral medicine).  Hormone replacement therapy to replace low testosterone levels.  Medicine that is injected into the penis. Your health care provider may instruct you how to give yourself these injections at home.  Vacuum pump. This is a pump with a ring on it. The pump and ring are placed on the penis and used to create pressure that helps the penis become erect.  Penile implant surgery. In this procedure, you may receive: ? An inflatable implant. This consists of cylinders, a pump, and a reservoir. The cylinders can be inflated with a fluid that helps to create an erection, and they can be deflated after intercourse. ? A semi-rigid implant. This consists of two silicone rubber rods. The rods provide some rigidity. They are also flexible, so the penis can both curve downward in its normal position and become straight for sexual intercourse.  Blood vessel surgery, to improve blood flow to the penis. During this procedure, a blood vessel from a different part of the body is placed into the penis to allow blood to flow around (bypass) damaged or blocked blood vessels.  Lifestyle changes, such as exercising more, losing weight, and quitting smoking. Follow these instructions at home: Medicines   Take over-the-counter and prescription medicines only as told by your health care provider. Do not increase the dosage without first discussing it with your health care provider.  If you are using self-injections, perform injections as directed by your   health care provider. Make sure to avoid any veins that are on the surface of the penis. After giving an injection, apply pressure to the injection site for 5 minutes. General instructions   Exercise regularly, as directed by your health care provider. Work with your health care provider to lose weight, if needed.  Do not use any products that contain nicotine or tobacco, such as cigarettes and e-cigarettes. If you need help quitting, ask your health care provider.  Before using a vacuum pump, read the instructions that come with the pump and discuss any questions with your health care provider.  Keep all follow-up visits as told by your health care provider. This is important. Contact a health care provider if:  You feel nauseous.  You vomit. Get help right away if:  You are taking oral or injectable medicines and you have an erection that lasts longer than 4 hours. If your health care provider is unavailable, go to the nearest emergency room for evaluation. An erection that lasts much longer than 4 hours can result in permanent damage to your penis.  You have severe pain in your groin or abdomen.  You develop redness or severe swelling of your penis.  You have redness spreading up into your groin or lower abdomen.  You are unable to urinate.  You experience chest pain or a rapid heart beat (palpitations) after taking oral medicines. Summary  Erectile dysfunction (ED) is the inability to get or keep an erection during sexual intercourse. This problem can usually be treated successfully.  This condition is diagnosed based on a physical exam, your symptoms, and tests to determine the cause. Treatment varies depending on the cause, and may include medicines, hormone therapy, surgery, or vacuum pump.  You may need follow-up visits to make sure that you are using your medicines or devices correctly.  Get help right away if you are taking or injecting medicines and you have an erection that lasts longer than 4 hours. This information is not intended to replace advice given to you by your health care provider. Make sure you discuss any questions you have with your health care  provider. Document Released: 08/24/2000 Document Revised: 08/09/2017 Document Reviewed: 09/12/2016 Elsevier Patient Education  2020 [place], Adult The ears produce a substance called earwax that helps keep bacteria out of the ear and protects the skin in the ear canal. Occasionally, earwax can build up in the ear and cause discomfort or hearing loss. What increases the risk? This condition is more likely to develop in people who:  Are male.  Are elderly.  Naturally produce more earwax.  Clean their ears often with cotton swabs.  Use earplugs often.  Use in-ear headphones often.  Wear hearing aids.  Have narrow ear canals.  Have earwax that is overly thick or sticky.  Have eczema.  Are dehydrated.  Have excess hair in the ear canal. What are the signs or symptoms? Symptoms of this condition include:  Reduced or muffled hearing.  A feeling of fullness in the ear or feeling that the ear is plugged.  Fluid coming from the ear.  Ear pain.  Ear itch.  Ringing in the ear.  Coughing.  An obvious piece of earwax that can be seen inside the ear canal. How is this diagnosed? This condition may be diagnosed based on:  Your symptoms.  Your medical history.  An ear exam. During the exam, your health care provider will look into your ear with an  instrument called an otoscope. You may have tests, including a hearing test. How is this treated? This condition may be treated by:  Using ear drops to soften the earwax.  Having the earwax removed by a health care provider. The health care provider may: ? Flush the ear with water. ? Use an instrument that has a loop on the end (curette). ? Use a suction device.  Surgery to remove the wax buildup. This may be done in severe cases. Follow these instructions at home:   Take over-the-counter and prescription medicines only as told by your health care provider.  Do not put any objects, including  cotton swabs, into your ear. You can clean the opening of your ear canal with a washcloth or facial tissue.  Follow instructions from your health care provider about cleaning your ears. Do not over-clean your ears.  Drink enough fluid to keep your urine clear or pale yellow. This will help to thin the earwax.  Keep all follow-up visits as told by your health care provider. If earwax builds up in your ears often or if you use hearing aids, consider seeing your health care provider for routine, preventive ear cleanings. Ask your health care provider how often you should schedule your cleanings.  If you have hearing aids, clean them according to instructions from the manufacturer and your health care provider. Contact a health care provider if:  You have ear pain.  You develop a fever.  You have blood, pus, or other fluid coming from your ear.  You have hearing loss.  You have ringing in your ears that does not go away.  Your symptoms do not improve with treatment.  You feel like the room is spinning (vertigo). Summary  Earwax can build up in the ear and cause discomfort or hearing loss.  The most common symptoms of this condition include reduced or muffled hearing and a feeling of fullness in the ear or feeling that the ear is plugged.  This condition may be diagnosed based on your symptoms, your medical history, and an ear exam.  This condition may be treated by using ear drops to soften the earwax or by having the earwax removed by a health care provider.  Do not put any objects, including cotton swabs, into your ear. You can clean the opening of your ear canal with a washcloth or facial tissue. This information is not intended to replace advice given to you by your health care provider. Make sure you discuss any questions you have with your health care provider. Document Released: 10/04/2004 Document Revised: 08/09/2017 Document Reviewed: 11/07/2016 Elsevier Patient Education   2020 [employer].

## 2019-06-23 ENCOUNTER — Other Ambulatory Visit (INDEPENDENT_AMBULATORY_CARE_PROVIDER_SITE_OTHER): Payer: Self-pay | Admitting: Primary Care

## 2019-06-23 LAB — CBC WITH DIFFERENTIAL/PLATELET
Basophils Absolute: 0.1 10*3/uL (ref 0.0–0.2)
Basos: 1 %
EOS (ABSOLUTE): 0.1 10*3/uL (ref 0.0–0.4)
Eos: 3 %
Hematocrit: 44.9 % (ref 37.5–51.0)
Hemoglobin: 15.1 g/dL (ref 13.0–17.7)
Immature Grans (Abs): 0 10*3/uL (ref 0.0–0.1)
Immature Granulocytes: 0 %
Lymphocytes Absolute: 2.1 10*3/uL (ref 0.7–3.1)
Lymphs: 40 %
MCH: 30.3 pg (ref 26.6–33.0)
MCHC: 33.6 g/dL (ref 31.5–35.7)
MCV: 90 fL (ref 79–97)
Monocytes Absolute: 0.5 10*3/uL (ref 0.1–0.9)
Monocytes: 9 %
Neutrophils Absolute: 2.5 10*3/uL (ref 1.4–7.0)
Neutrophils: 47 %
Platelets: 220 10*3/uL (ref 150–450)
RBC: 4.99 x10E6/uL (ref 4.14–5.80)
RDW: 13.1 % (ref 11.6–15.4)
WBC: 5.3 10*3/uL (ref 3.4–10.8)

## 2019-06-23 LAB — LIPID PANEL
Chol/HDL Ratio: 3.8 ratio (ref 0.0–5.0)
Cholesterol, Total: 189 mg/dL (ref 100–199)
HDL: 50 mg/dL (ref >39)
LDL Chol Calc (NIH): 128 mg/dL — ABNORMAL HIGH (ref 0–99)
Triglycerides: 58 mg/dL (ref 0–149)
VLDL Cholesterol Cal: 11 mg/dL (ref 5–40)

## 2019-06-23 LAB — CMP14+EGFR
ALT: 5 IU/L (ref 0–44)
AST: 12 IU/L (ref 0–40)
Albumin/Globulin Ratio: 1.6 (ref 1.2–2.2)
Albumin: 4.4 g/dL (ref 4.0–5.0)
Alkaline Phosphatase: 75 IU/L (ref 39–117)
BUN/Creatinine Ratio: 9 (ref 9–20)
BUN: 9 mg/dL (ref 6–24)
Bilirubin Total: 0.3 mg/dL (ref 0.0–1.2)
CO2: 24 mmol/L (ref 20–29)
Calcium: 9.4 mg/dL (ref 8.7–10.2)
Chloride: 104 mmol/L (ref 96–106)
Creatinine, Ser: 1.02 mg/dL (ref 0.76–1.27)
GFR calc Af Amer: 101 mL/min/{1.73_m2} (ref >59)
GFR calc non Af Amer: 87 mL/min/{1.73_m2} (ref >59)
Globulin, Total: 2.8 g/dL (ref 1.5–4.5)
Glucose: 101 mg/dL — ABNORMAL HIGH (ref 65–99)
Potassium: 4.2 mmol/L (ref 3.5–5.2)
Sodium: 140 mmol/L (ref 134–144)
Total Protein: 7.2 g/dL (ref 6.0–8.5)

## 2019-06-23 LAB — PSA: Prostate Specific Ag, Serum: 0.8 ng/mL (ref 0.0–4.0)

## 2019-06-23 MED ORDER — EZETIMIBE 10 MG PO TABS: 10.0000 mg | ORAL_TABLET | Freq: Every day | ORAL | 0 refills | Status: DC

## 2019-06-23 MED FILL — EZETIMIBE 10 MG TAB: 10 | 30 days supply | Qty: 30 | Fill #0

## 2019-06-24 ENCOUNTER — Telehealth (INDEPENDENT_AMBULATORY_CARE_PROVIDER_SITE_OTHER): Payer: Self-pay

## 2019-06-24 NOTE — Telephone Encounter (Signed)
Patient is aware that PSA is normal. LDL is elevated. Elevated LDL can lead to stroke and heart attack. Atorvastatin sent in to help lower cholesterol. Advised patient to decrease fatty food, red meat, cheese and milk and increase fiber with whole grains and veggies. Patient expressed understanding of results. Nat Christen, CMA

## 2019-06-24 NOTE — Telephone Encounter (Signed)
-----   Message from Kerin Perna, NP sent at 06/23/2019  8:51 AM EDT ----- PSA normal .  LDL is elevated . LDL is the bad cholesterol that can lead to heart attack and stroke. To lower your number you can decrease your fatty foods, red meat, cheese, milk and increase fiber like whole grains and veggies. Sent in

## 2019-07-20 MED FILL — EZETIMIBE 10 MG TAB: 10 | 30 days supply | Qty: 30 | Fill #0

## 2019-07-20 MED FILL — SILDENAFIL CITRATE 100 MG T: 100 | 15 days supply | Qty: 5 | Fill #1

## 2019-12-09 ENCOUNTER — Encounter (INDEPENDENT_AMBULATORY_CARE_PROVIDER_SITE_OTHER): Payer: Self-pay | Admitting: Primary Care

## 2019-12-09 ENCOUNTER — Other Ambulatory Visit: Payer: Self-pay

## 2019-12-09 ENCOUNTER — Ambulatory Visit (INDEPENDENT_AMBULATORY_CARE_PROVIDER_SITE_OTHER): Payer: Self-pay | Admitting: Primary Care

## 2019-12-09 VITALS — BP 129/90 | HR 65 | Temp 97.4°F | Ht 74.0 in | Wt 188.0 lb

## 2019-12-09 DIAGNOSIS — M549 Dorsalgia, unspecified: Secondary | ICD-10-CM

## 2019-12-09 DIAGNOSIS — R351 Nocturia: Secondary | ICD-10-CM

## 2019-12-09 DIAGNOSIS — E782 Mixed hyperlipidemia: Secondary | ICD-10-CM

## 2019-12-09 DIAGNOSIS — Z131 Encounter for screening for diabetes mellitus: Secondary | ICD-10-CM

## 2019-12-09 DIAGNOSIS — F17200 Nicotine dependence, unspecified, uncomplicated: Secondary | ICD-10-CM

## 2019-12-09 DIAGNOSIS — Z125 Encounter for screening for malignant neoplasm of prostate: Secondary | ICD-10-CM

## 2019-12-09 LAB — POCT GLYCOSYLATED HEMOGLOBIN (HGB A1C): Hemoglobin A1C: 5.9 % — AB (ref 4.0–5.6)

## 2019-12-09 NOTE — Progress Notes (Signed)
Back pain increases with driving

## 2019-12-09 NOTE — Progress Notes (Signed)
Established Patient Office Visit  Subjective:  Patient ID: Jeremy Randall, male    DOB: [birth year]  Age: 49 y.o. MRN: [id number]  CC:  Chief Complaint  Patient presents with  . Back Pain    upper  . Anorexia  . 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 Looking at his posture on the exam table problem 1 problem 2 increase risk for DVT statis in blood discuss risk, 3 smoker increases risk of 2 .   Past Medical History:  Diagnosis Date  . Asthma     Past Surgical History:  Procedure Laterality Date  . APPENDECTOMY    . PATELLAR TENDON REPAIR      Family History  Problem Relation Age of Onset  . Diabetes Mother   . Hypertension Mother   . Cancer Sister   . Hypertension Sister   . Hypertension Father   . Diabetes Maternal Aunt     Social History   Socioeconomic History  . Marital status: Significant Other    Spouse name: Not on file  . Number of children: Not on file  . Years of education: Not on file  . Highest education level: Not on file  Occupational History  . Not on file  Tobacco Use  . Smoking status: Current Every Day Smoker    Packs/day: 0.50    Years: 25.00    Pack years: 12.50    Types: Cigarettes  . Smokeless tobacco: Never Used  Substance and Sexual Activity  . Alcohol use: Yes    Comment: rare  . Drug use: Yes    Types: Marijuana    Comment: occ  . Sexual activity: Not on file  Other Topics Concern  . Not on file  Social History Narrative  . Not on file   Social Determinants of Health   Financial Resource Strain:   . Difficulty of Paying Living Expenses:   Food Insecurity:   . Worried About Charity fundraiser in the Last Year:   . Arboriculturist in the Last Year:   Transportation Needs:   . Film/video editor (Medical):   Marland Kitchen Lack of Transportation (Non-Medical):   Physical Activity:   . Days of Exercise  per Week:   . Minutes of Exercise per Session:   Stress:   . Feeling of Stress :   Social Connections:   . Frequency of Communication with Friends and Family:   . Frequency of Social Gatherings with Friends and Family:   . Attends Religious Services:   . Active Member of Clubs or Organizations:   . Attends Archivist Meetings:   Marland Kitchen Marital Status:   Intimate Partner Violence:   . Fear of Current or Ex-Partner:   . Emotionally Abused:   Marland Kitchen Physically Abused:   . Sexually Abused:     Outpatient Medications Prior to Visit  Medication Sig Dispense Refill  . ezetimibe (ZETIA) 10 MG tablet Take 1 tablet (10 mg total) by mouth daily. (Patient not taking: Reported on 12/09/2019) 90 tablet 0  . sildenafil (VIAGRA) 100 MG tablet Take 0.5-1 tablets (50-100 mg total) by mouth daily as needed for erectile dysfunction. (Patient not taking: Reported on 12/09/2019) 5 tablet 3  . doxycycline (VIBRAMYCIN) 100 MG capsule Take 1 capsule (100 mg total) by mouth 2 (two) times daily. 20 capsule 0  No facility-administered medications prior to visit.    No Known Allergies  ROS Review of Systems  Genitourinary: Positive for frequency and urgency.  All other systems reviewed and are negative.     Objective:    Physical Exam  Constitutional: He is oriented to person, place, and time. He appears well-developed and well-nourished.  HENT:  Head: Normocephalic.  Eyes: Pupils are equal, round, and reactive to light. EOM are normal.  Cardiovascular: Normal rate and regular rhythm.  Pulmonary/Chest: Effort normal and breath sounds normal.  Abdominal: Soft. Bowel sounds are normal.  Musculoskeletal:        General: Normal range of motion.     Cervical back: Normal range of motion and neck supple.  Neurological: He is alert and oriented to person, place, and time. He has normal reflexes.  Skin: Skin is warm and dry.  Psychiatric: He has a normal mood and affect. His behavior is normal. Judgment  and thought content normal.    BP 129/90 (BP Location: Right Arm, Patient Position: Sitting, Cuff Size: Normal)   Pulse 65   Temp (!) 97.4 F (36.3 C) (Temporal)   Ht '6\' 2"'  (1.88 m)   Wt 188 lb (85.3 kg)   SpO2 99%   BMI 24.14 kg/m  Wt Readings from Last 3 Encounters:  12/09/19 188 lb (85.3 kg)  06/22/19 206 lb 9.6 oz (93.7 kg)  05/06/18 179 lb 8 oz (81.4 kg)     There are no preventive care reminders to display for this patient.  There are no preventive care reminders to display for this patient.  Lab Results  Component Value Date   TSH 0.604 05/26/2014   Lab Results  Component Value Date   WBC 5.3 06/22/2019   HGB 15.1 06/22/2019   HCT 44.9 06/22/2019   MCV 90 06/22/2019   PLT 220 06/22/2019   Lab Results  Component Value Date   NA 140 06/22/2019   K 4.2 06/22/2019   CO2 24 06/22/2019   GLUCOSE 101 (H) 06/22/2019   BUN 9 06/22/2019   CREATININE 1.02 06/22/2019   BILITOT 0.3 06/22/2019   ALKPHOS 75 06/22/2019   AST 12 06/22/2019   ALT 5 06/22/2019   PROT 7.2 06/22/2019   ALBUMIN 4.4 06/22/2019   CALCIUM 9.4 06/22/2019   ANIONGAP 9 05/06/2018   Lab Results  Component Value Date   CHOL 189 06/22/2019   Lab Results  Component Value Date   HDL 50 06/22/2019   Lab Results  Component Value Date   LDLCALC 128 (H) 06/22/2019   Lab Results  Component Value Date   TRIG 58 06/22/2019   Lab Results  Component Value Date   CHOLHDL 3.8 06/22/2019   Lab Results  Component Value Date   HGBA1C 5.9 (A) 12/09/2019      Assessment & Plan:  Jeremy Randall was seen today for back pain, anorexia and unable to hold urine.  Diagnoses and all orders for this visit:  Screening for diabetes mellitus -     HgB A1c -     CBC with Differential  Smoker  Acute bilateral back pain, unspecified back location  Mixed hyperlipidemia Not taking Zetia increase cholesterol that can lead to heart attack and stroke. Discuss decreasing your fatty foods, red meat, cheese, milk  and increase fiber like whole grains and veggies. You can also add a fiber supplement like Metamucil or Benefiber.  -     Lipid Panel -     CMP14+EGFR -  CBC with Differential  Screening for prostate cancer Prostrate Cancer Screening For men aged 48 to 49 years, the decision to undergo periodic prostate-specific antigen (PSA)-based screening for prostate cancer should be an individual one.   Nocturia Rule out  -     PSA   No orders of the defined types were placed in this encounter.   Follow-up: Return in about 6 months (around 06/09/2020) for in person physical.    Kerin Perna, NP

## 2019-12-09 NOTE — Patient Instructions (Signed)

## 2019-12-10 LAB — LIPID PANEL
Chol/HDL Ratio: 3.3 ratio (ref 0.0–5.0)
Cholesterol, Total: 166 mg/dL (ref 100–199)
HDL: 50 mg/dL (ref >39)
LDL Chol Calc (NIH): 102 mg/dL — ABNORMAL HIGH (ref 0–99)
Triglycerides: 71 mg/dL (ref 0–149)
VLDL Cholesterol Cal: 14 mg/dL (ref 5–40)

## 2019-12-10 LAB — CBC WITH DIFFERENTIAL/PLATELET
Basophils Absolute: 0.1 10*3/uL (ref 0.0–0.2)
Basos: 1 %
EOS (ABSOLUTE): 0.2 10*3/uL (ref 0.0–0.4)
Eos: 2 %
Hematocrit: 44.8 % (ref 37.5–51.0)
Hemoglobin: 15.3 g/dL (ref 13.0–17.7)
Immature Grans (Abs): 0 10*3/uL (ref 0.0–0.1)
Immature Granulocytes: 0 %
Lymphocytes Absolute: 1.9 10*3/uL (ref 0.7–3.1)
Lymphs: 29 %
MCH: 29.9 pg (ref 26.6–33.0)
MCHC: 34.2 g/dL (ref 31.5–35.7)
MCV: 88 fL (ref 79–97)
Monocytes Absolute: 0.6 10*3/uL (ref 0.1–0.9)
Monocytes: 10 %
Neutrophils Absolute: 3.7 10*3/uL (ref 1.4–7.0)
Neutrophils: 58 %
Platelets: 223 10*3/uL (ref 150–450)
RBC: 5.12 x10E6/uL (ref 4.14–5.80)
RDW: 12.4 % (ref 11.6–15.4)
WBC: 6.4 10*3/uL (ref 3.4–10.8)

## 2019-12-10 LAB — CMP14+EGFR
ALT: 7 IU/L (ref 0–44)
AST: 14 IU/L (ref 0–40)
Albumin/Globulin Ratio: 1.5 (ref 1.2–2.2)
Albumin: 4.3 g/dL (ref 4.0–5.0)
Alkaline Phosphatase: 83 IU/L (ref 39–117)
BUN/Creatinine Ratio: 7 — ABNORMAL LOW (ref 9–20)
BUN: 7 mg/dL (ref 6–24)
Bilirubin Total: 0.3 mg/dL (ref 0.0–1.2)
CO2: 25 mmol/L (ref 20–29)
Calcium: 9.6 mg/dL (ref 8.7–10.2)
Chloride: 100 mmol/L (ref 96–106)
Creatinine, Ser: 1.07 mg/dL (ref 0.76–1.27)
GFR calc Af Amer: 95 mL/min/{1.73_m2} (ref >59)
GFR calc non Af Amer: 82 mL/min/{1.73_m2} (ref >59)
Globulin, Total: 2.8 g/dL (ref 1.5–4.5)
Glucose: 94 mg/dL (ref 65–99)
Potassium: 3.9 mmol/L (ref 3.5–5.2)
Sodium: 138 mmol/L (ref 134–144)
Total Protein: 7.1 g/dL (ref 6.0–8.5)

## 2019-12-10 LAB — PSA: Prostate Specific Ag, Serum: 1.3 ng/mL (ref 0.0–4.0)

## 2019-12-18 ENCOUNTER — Telehealth (INDEPENDENT_AMBULATORY_CARE_PROVIDER_SITE_OTHER): Payer: Self-pay

## 2019-12-18 NOTE — Telephone Encounter (Signed)
Left voicemail per DPR notifying patient that Labs are normal except LDL try decreasing your fatty foods, red meat, cheese, milk and increase fiber like whole grains and veggies. You can also add a fiber supplement like Metamucil or Benefiber. Return call to [phone] with any questions or concerns. Maryjean Morn, CMA

## 2019-12-18 NOTE — Telephone Encounter (Signed)
-----   Message from Grayce Sessions, NP sent at 12/17/2019 10:08 PM EDT ----- Labs are normal except LDL try decreasing your fatty foods, red meat, cheese, milk and increase fiber like whole grains and veggies. You can also add a fiber supplement like Metamucil or Benefiber.

## 2020-06-09 ENCOUNTER — Ambulatory Visit (INDEPENDENT_AMBULATORY_CARE_PROVIDER_SITE_OTHER): Payer: Self-pay | Admitting: Primary Care

## 2020-06-21 ENCOUNTER — Ambulatory Visit (INDEPENDENT_AMBULATORY_CARE_PROVIDER_SITE_OTHER): Payer: Self-pay | Admitting: Primary Care

## 2020-06-21 ENCOUNTER — Encounter (INDEPENDENT_AMBULATORY_CARE_PROVIDER_SITE_OTHER): Payer: Self-pay | Admitting: Primary Care

## 2020-06-21 ENCOUNTER — Other Ambulatory Visit: Payer: Self-pay

## 2020-06-21 VITALS — BP 122/79 | HR 59 | Temp 98.1°F | Ht 74.0 in | Wt 190.6 lb

## 2020-06-21 DIAGNOSIS — F17200 Nicotine dependence, unspecified, uncomplicated: Secondary | ICD-10-CM

## 2020-06-21 DIAGNOSIS — E7800 Pure hypercholesterolemia, unspecified: Secondary | ICD-10-CM

## 2020-06-21 DIAGNOSIS — Z0000 Encounter for general adult medical examination without abnormal findings: Secondary | ICD-10-CM

## 2020-06-21 DIAGNOSIS — Z1159 Encounter for screening for other viral diseases: Secondary | ICD-10-CM

## 2020-06-21 DIAGNOSIS — R351 Nocturia: Secondary | ICD-10-CM

## 2020-06-21 NOTE — Patient Instructions (Addendum)
Labs only visit   Preventive Care 77-48 Years Old, Male Preventive care refers to lifestyle choices and visits with your health care provider that can promote health and wellness. This includes:  A yearly physical exam. This is also called an annual well check.  Regular dental and eye exams.  Immunizations.  Screening for certain conditions.  Healthy lifestyle choices, such as eating a healthy diet, getting regular exercise, not using drugs or products that contain nicotine and tobacco, and limiting alcohol use. What can I expect for my preventive care visit? Physical exam Your health care provider will check:  Height and weight. These may be used to calculate body mass index (BMI), which is a measurement that tells if you are at a healthy weight.  Heart rate and blood pressure.  Your skin for abnormal spots. Counseling Your health care provider may ask you questions about:  Alcohol, tobacco, and drug use.  Emotional well-being.  Home and relationship well-being.  Sexual activity.  Eating habits.  Work and work Astronomer. What immunizations do I need?  Influenza (flu) vaccine  This is recommended every year. Tetanus, diphtheria, and pertussis (Tdap) vaccine  You may need a Td booster every 10 years. Varicella (chickenpox) vaccine  You may need this vaccine if you have not already been vaccinated. Zoster (shingles) vaccine  You may need this after age 30. Measles, mumps, and rubella (MMR) vaccine  You may need at least one dose of MMR if you were born in 1957 or later. You may also need a second dose. Pneumococcal conjugate (PCV13) vaccine  You may need this if you have certain conditions and were not previously vaccinated. Pneumococcal polysaccharide (PPSV23) vaccine  You may need one or two doses if you smoke cigarettes or if you have certain conditions. Meningococcal conjugate (MenACWY) vaccine  You may need this if you have certain  conditions. Hepatitis A vaccine  You may need this if you have certain conditions or if you travel or work in places where you may be exposed to hepatitis A. Hepatitis B vaccine  You may need this if you have certain conditions or if you travel or work in places where you may be exposed to hepatitis B. Haemophilus influenzae type b (Hib) vaccine  You may need this if you have certain risk factors. Human papillomavirus (HPV) vaccine  If recommended by your health care provider, you may need three doses over 48 months. You may receive vaccines as individual doses or as more than one vaccine together in one shot (combination vaccines). Talk with your health care provider about the risks and benefits of combination vaccines. What tests do I need? Blood tests  Lipid and cholesterol levels. These may be checked every 5 years, or more frequently if you are over 48 years old.  Hepatitis C test.  Hepatitis B test. Screening  Lung cancer screening. You may have this screening every year starting at age 48 if you have a 30-pack-year history of smoking and currently smoke or have quit within the past 15 years.  Prostate cancer screening. Recommendations will vary depending on your family history and other risks.  Colorectal cancer screening. All adults should have this screening starting at age 48 and continuing until age 48. Your health care provider may recommend screening at age 1 if you are at increased risk. You will have tests every 1-10 years, depending on your results and the type of screening test.  Diabetes screening. This is done by checking your blood sugar (glucose)  after you have not eaten for a while (fasting). You may have this done every 1-3 years.  Sexually transmitted disease (STD) testing. Follow these instructions at home: Eating and drinking  Eat a diet that includes fresh fruits and vegetables, whole grains, lean protein, and low-fat dairy products.  Take vitamin and  mineral supplements as recommended by your health care provider.  Do not drink alcohol if your health care provider tells you not to drink.  If you drink alcohol: ? Limit how much you have to 0-2 drinks a day. ? Be aware of how much alcohol is in your drink. In the [place] one drink equals one 12 oz bottle of beer (355 mL), one 5 oz glass of wine (148 mL), or one 1 oz glass of hard liquor (44 mL). Lifestyle  Take daily care of your teeth and gums.  Stay active. Exercise for at least 30 minutes on 5 or more days each week.  Do not use any products that contain nicotine or tobacco, such as cigarettes, e-cigarettes, and chewing tobacco. If you need help quitting, ask your health care provider.  If you are sexually active, practice safe sex. Use a condom or other form of protection to prevent STIs (sexually transmitted infections).  Talk with your health care provider about taking a low-dose aspirin every day starting at age 48. What's next?  Go to your health care provider once a year for a well check visit.  Ask your health care provider how often you should have your eyes and teeth checked.  Stay up to date on all vaccines. This information is not intended to replace advice given to you by your health care provider. Make sure you discuss any questions you have with your health care provider. Document Revised: 08/21/2018 Document Reviewed: 08/21/2018 Elsevier Patient Education  2020 [employer].

## 2020-06-21 NOTE — Progress Notes (Signed)
Established Patient Office Visit  Subjective:  Patient ID: Jeremy Randall, male    DOB: [birth year]  Age: 49 y.o. MRN: [id number]  CC:  Chief Complaint  Patient presents with   Annual Exam    HPI Mr. Jeremy Randall is a 49 year old male who presents for annual physical.  He voices 1 concern urgency frequency and nocturia.  Also cannot hold urine when he has to urinate he has to go then.  Past Medical History:  Diagnosis Date   Asthma     Past Surgical History:  Procedure Laterality Date   APPENDECTOMY     PATELLAR TENDON REPAIR      Family History  Problem Relation Age of Onset   Diabetes Mother    Hypertension Mother    Cancer Sister    Hypertension Sister    Hypertension Father    Diabetes Maternal Aunt     Social History   Socioeconomic History   Marital status: Significant Other    Spouse name: Not on file   Number of children: Not on file   Years of education: Not on file   Highest education level: Not on file  Occupational History   Not on file  Tobacco Use   Smoking status: Current Every Day Smoker    Packs/day: 0.50    Years: 25.00    Pack years: 12.50    Types: Cigarettes   Smokeless tobacco: Never Used  Vaping Use   Vaping Use: Never used  Substance and Sexual Activity   Alcohol use: Yes    Comment: rare   Drug use: Yes    Types: Marijuana    Comment: occ   Sexual activity: Not on file  Other Topics Concern   Not on file  Social History Narrative   Not on file   Social Determinants of Health   Financial Resource Strain:    Difficulty of Paying Living Expenses: Not on file  Food Insecurity:    Worried About Running Out of Food in the Last Year: Not on file   [employer] of Food in the Last Year: Not on file  Transportation Needs:    Lack of Transportation (Medical): Not on file   Lack of Transportation (Non-Medical): Not on file  Physical Activity:    Days of Exercise per Week: Not on file   Minutes of Exercise  per Session: Not on file  Stress:    Feeling of Stress : Not on file  Social Connections:    Frequency of Communication with Friends and Family: Not on file   Frequency of Social Gatherings with Friends and Family: Not on file   Attends Religious Services: Not on file   Active Member of Clubs or Organizations: Not on file   Attends Banker Meetings: Not on file   Marital Status: Not on file  Intimate Partner Violence:    Fear of Current or Ex-Partner: Not on file   Emotionally Abused: Not on file   Physically Abused: Not on file   Sexually Abused: Not on file    Outpatient Medications Prior to Visit  Medication Sig Dispense Refill   ezetimibe (ZETIA) 10 MG tablet Take 1 tablet (10 mg total) by mouth daily. (Patient not taking: Reported on 12/09/2019) 90 tablet 0   sildenafil (VIAGRA) 100 MG tablet Take 0.5-1 tablets (50-100 mg total) by mouth daily as needed for erectile dysfunction. (Patient not taking: Reported on 12/09/2019) 5 tablet 3   No facility-administered medications prior to visit.  No Known Allergies  ROS Review of Systems  Genitourinary: Positive for frequency.       Nocturia  All other systems reviewed and are negative.     Objective:     Vitals:   06/21/20 1020  BP: 122/79  Pulse: (!) 59  Temp: 98.1 F (36.7 C)  TempSrc: Temporal  SpO2: 98%  Weight: 190 lb 9.6 oz (86.5 kg)  Height: 6\' 2"  (1.88 m)   Physical Exam Vitals reviewed.  Constitutional:      Appearance: Normal appearance. He is normal weight.  HENT:     Head: Normocephalic.     Right Ear: Tympanic membrane normal.     Left Ear: Tympanic membrane normal.     Nose: Nose normal.  Eyes:     Extraocular Movements: Extraocular movements intact.     Pupils: Pupils are equal, round, and reactive to light.  Cardiovascular:     Rate and Rhythm: Normal rate and regular rhythm.  Pulmonary:     Effort: Pulmonary effort is normal.     Breath sounds: Normal breath  sounds.  Abdominal:     General: Abdomen is flat. Bowel sounds are normal.  Musculoskeletal:        General: Normal range of motion.  Skin:    General: Skin is warm and dry.     Comments: discoloration of skin  Neurological:     Mental Status: He is alert and oriented to person, place, and time.  Psychiatric:        Mood and Affect: Mood normal.        Thought Content: Thought content normal.        Judgment: Judgment normal.    General: Vital signs reviewed.  Patient is well-developed and well-nourished, male in no acute distress and cooperative with exam.  Head: Normocephalic and atraumatic. Eyes: EOMI, conjunctivae normal, no scleral icterus.  Neck: Supple, trachea midline, normal ROM, no JVD, masses, thyromegaly, or carotid bruit present.  Cardiovascular: RRR, S1 normal, S2 normal, no murmurs, gallops, or rubs. Pulmonary/Chest: Clear to auscultation bilaterally, no wheezes, rales, or rhonchi. Abdominal: Soft, non-tender, non-distended, BS +, no masses, organomegaly, or guarding present.  Musculoskeletal: No joint deformities, erythema, or stiffness, ROM full and nontender. Extremities: No lower extremity edema bilaterally,  pulses symmetric and intact bilaterally. No cyanosis or clubbing. Neurological: A&O x3, Strength is normal and symmetric bilaterally, cranial nerve II-XII are grossly intact, no focal motor deficit, sensory intact to light touch bilaterally.  Skin: Warm, dry and intact. No rashes or erythema. Psychiatric: Normal mood and affect. speech and behavior is normal. Cognition and memory are normal.   Health Maintenance Due  Topic Date Due   Hepatitis C Screening  Never done   COVID-19 Vaccine (1) Never done    There are no preventive care reminders to display for this patient.  Lab Results  Component Value Date   TSH 0.604 05/26/2014   Lab Results  Component Value Date   WBC 6.4 12/09/2019   HGB 15.3 12/09/2019   HCT 44.8 12/09/2019   MCV 88 12/09/2019    PLT 223 12/09/2019   Lab Results  Component Value Date   NA 138 12/09/2019   K 3.9 12/09/2019   CO2 25 12/09/2019   GLUCOSE 94 12/09/2019   BUN 7 12/09/2019   CREATININE 1.07 12/09/2019   BILITOT 0.3 12/09/2019   ALKPHOS 83 12/09/2019   AST 14 12/09/2019   ALT 7 12/09/2019   PROT 7.1 12/09/2019   ALBUMIN 4.3  12/09/2019   CALCIUM 9.6 12/09/2019   ANIONGAP 9 05/06/2018   Lab Results  Component Value Date   CHOL 166 12/09/2019   Lab Results  Component Value Date   HDL 50 12/09/2019   Lab Results  Component Value Date   LDLCALC 102 (H) 12/09/2019   Lab Results  Component Value Date   TRIG 71 12/09/2019   Lab Results  Component Value Date   CHOLHDL 3.3 12/09/2019   Lab Results  Component Value Date   HGBA1C 5.9 (A) 12/09/2019      Assessment & Plan:  Muhannad was seen today for annual exam.  Diagnoses and all orders for this visit:  Encounter for annual physical exam Completed   Elevated LDL cholesterol level Elevatated LDL and stop taking Zetia discussed LDL bad cholesterol and increase risk of heart attacks and strokes. -     Lipid panel; Future  Encounter for hepatitis C screening test for low risk patient Preventive care health maintenance and care gaps. -     Hepatitis C Antibody; Future  Nocturia -     Cancel: PSA -     PSA; Future  Smoker Enjoys black & mild cigars aware consequences of smoking Increased risk for lung cancer and respiratory complications is visit will encourage to stop/cessation No orders of the defined types were placed in this encounter.   Follow-up: Return if symptoms worsen or fail to improve.    Grayce Sessions, NP

## 2021-01-05 ENCOUNTER — Other Ambulatory Visit: Payer: Self-pay

## 2021-01-05 ENCOUNTER — Ambulatory Visit (HOSPITAL_COMMUNITY)
Admission: EM | Admit: 2021-01-05 | Discharge: 2021-01-05 | Disposition: A | Discharge status: Home / Self Care | Payer: Self-pay | Attending: Physician Assistant | Admitting: Physician Assistant

## 2021-01-05 ENCOUNTER — Encounter (HOSPITAL_COMMUNITY): Payer: Self-pay | Admitting: Emergency Medicine

## 2021-01-05 ENCOUNTER — Ambulatory Visit (INDEPENDENT_AMBULATORY_CARE_PROVIDER_SITE_OTHER): Payer: Self-pay

## 2021-01-05 DIAGNOSIS — R300 Dysuria: Secondary | ICD-10-CM

## 2021-01-05 DIAGNOSIS — M6281 Muscle weakness (generalized): Secondary | ICD-10-CM

## 2021-01-05 DIAGNOSIS — R1030 Lower abdominal pain, unspecified: Secondary | ICD-10-CM

## 2021-01-05 DIAGNOSIS — R8290 Unspecified abnormal findings in urine: Secondary | ICD-10-CM

## 2021-01-05 DIAGNOSIS — M79602 Pain in left arm: Secondary | ICD-10-CM

## 2021-01-05 LAB — POCT URINALYSIS DIPSTICK, ED / UC
Bilirubin Urine: NEGATIVE
Glucose, UA: NEGATIVE mg/dL
Ketones, ur: NEGATIVE mg/dL
Leukocytes,Ua: NEGATIVE
Nitrite: NEGATIVE
Protein, ur: NEGATIVE mg/dL
Specific Gravity, Urine: 1.025 (ref 1.005–1.030)
Urobilinogen, UA: 1 mg/dL (ref 0.0–1.0)
pH: 6 (ref 5.0–8.0)

## 2021-01-05 MED ORDER — NAPROXEN 375 MG PO TABS
375.0000 mg | ORAL_TABLET | Freq: Two times a day (BID) | ORAL | 0 refills | Status: DC
  Filled 2021-01-05: qty 20, 10d supply, fill #0

## 2021-01-05 MED ORDER — CEPHALEXIN 500 MG PO CAPS
500.0000 mg | ORAL_CAPSULE | Freq: Three times a day (TID) | ORAL | 0 refills | Status: DC
  Filled 2021-01-05: qty 21, 7d supply, fill #0

## 2021-01-05 NOTE — ED Provider Notes (Signed)
[facility]    CSN: [id number] Arrival date & time: 01/05/21  1424      History   Chief Complaint Chief Complaint  Patient presents with  . Abdominal Pain  . Arm Pain    HPI Breckyn Ticas is a 50 y.o. male.   Patient presents today for evaluation of several concerns.  He reports a several week history of intermittent lower abdominal pain.  He denies any dysuria but does report an unusual sensation with urinating intermittently.  He also reports a previous burning sensation during sexual activity.  He has no concern for STIs denies any penile discharge.  Reports he is in a monogamous relationship and has been for the past 16 months.  He denies any nausea, vomiting, diarrhea, constipation, melena, hematochezia.  He has not tried any medication for symptom management.  He is status post appendectomy but denies additional abdominal surgery.  He reports symptoms are intermittent without identifiable trigger and not particularly bothersome.  During episodes, pain is rated 4 on a 0-10 pain scale, localized lower abdomen, described as aching, no aggravating relieving factors identified.  In addition, patient reports a month-long history of left arm pain following injury.  Reports that he was trying to stabilize boxes that were following when he felt a sudden pain in his left upper arm that has been persistent since that time.  He does report decreased strength as well.  He has tried Tylenol and ibuprofen without improvement of symptoms.  He is right-handed.  Denies any weakness or numbness.  He has not been evaluated for this in the past.     Past Medical History:  Diagnosis Date  . Asthma     Patient Active Problem List   Diagnosis Date Noted  . Pain, dental 05/26/2014  . H/O dental abscess 05/26/2014  . Gastritis 05/26/2014  . Smoking 05/26/2014  . Atrial fibrillation (HCC) 05/10/2012  . Abnormal finding on EKG 05/10/2012  . Nausea and vomiting 05/10/2012  . Prolonged Q-T  interval on ECG 05/10/2012  . Hypokalemia 05/10/2012    Past Surgical History:  Procedure Laterality Date  . APPENDECTOMY    . PATELLAR TENDON REPAIR         Home Medications    Prior to Admission medications   Medication Sig Start Date End Date Taking? Authorizing Provider  cephALEXin (KEFLEX) 500 MG capsule Take 1 capsule (500 mg total) by mouth 3 (three) times daily. 01/05/21  Yes Amylia Collazos K, [name]  naproxen (NAPROSYN) 375 MG tablet Take 1 tablet (375 mg total) by mouth 2 (two) times daily. 01/05/21  Yes Jilliana Burkes, Denny Peon K, [name]  ezetimibe (ZETIA) 10 MG tablet Take 1 tablet (10 mg total) by mouth daily. Patient not taking: Reported on 12/09/2019 06/23/19   Grayce Sessions, NP  sildenafil (VIAGRA) 100 MG tablet Take 0.5-1 tablets (50-100 mg total) by mouth daily as needed for erectile dysfunction. Patient not taking: Reported on 12/09/2019 06/22/19   Grayce Sessions, NP    Family History Family History  Problem Relation Age of Onset  . Diabetes Mother   . Hypertension Mother   . Cancer Sister   . Hypertension Sister   . Hypertension Father   . Diabetes Maternal Aunt     Social History Social History   Tobacco Use  . Smoking status: Current Every Day Smoker    Packs/day: 0.50    Years: 25.00    Pack years: 12.50    Types: Cigarettes  . Smokeless tobacco: Never  Used  Vaping Use  . Vaping Use: Never used  Substance Use Topics  . Alcohol use: Yes    Comment: rare  . Drug use: Yes    Types: Marijuana    Comment: occ     Allergies   Patient has no known allergies.   Review of Systems Review of Systems  Constitutional: Negative for activity change, appetite change, fatigue and fever.  Respiratory: Negative for cough and shortness of breath.   Cardiovascular: Negative for chest pain.  Gastrointestinal: Positive for abdominal pain. Negative for blood in stool, constipation, diarrhea, nausea, rectal pain and vomiting.  Genitourinary: Negative for dysuria,  frequency, hematuria, penile discharge, penile pain and urgency.  Musculoskeletal: Positive for myalgias (left arm). Negative for arthralgias.  Neurological: Negative for dizziness, light-headedness and headaches.     Physical Exam Triage Vital Signs ED Triage Vitals [01/05/21 1509]  Enc Vitals Group     BP (!) 152/88     Pulse Rate (!) 59     Resp 16     Temp 99.4 F (37.4 C)     Temp Source Oral     SpO2 100 %     Weight      Height      Head Circumference      Peak Flow      Pain Score 0     Pain Loc      Pain Edu?      Excl. in GC?    No data found.  Updated Vital Signs BP (!) 152/88 (BP Location: Right Arm)   Pulse (!) 59   Temp 99.4 F (37.4 C) (Oral)   Resp 16   SpO2 100%   Visual Acuity Right Eye Distance:   Left Eye Distance:   Bilateral Distance:    Right Eye Near:   Left Eye Near:    Bilateral Near:     Physical Exam Vitals reviewed.  Constitutional:      General: He is awake.     Appearance: Normal appearance. He is normal weight. He is not ill-appearing.     Comments: Very pleasant male appears stated age in no acute distress  HENT:     Head: Normocephalic and atraumatic.  Cardiovascular:     Rate and Rhythm: Normal rate and regular rhythm.     Heart sounds: No murmur heard.   Pulmonary:     Effort: Pulmonary effort is normal.     Breath sounds: Normal breath sounds. No stridor. No wheezing, rhonchi or rales.     Comments: Clear to auscultation bilaterally Abdominal:     General: Bowel sounds are normal.     Palpations: Abdomen is soft.     Tenderness: There is abdominal tenderness in the suprapubic area. There is no right CVA tenderness, left CVA tenderness, guarding or rebound.     Comments: Mild tenderness palpation of suprapubic region.  No evidence of acute abdomen on physical exam.  Musculoskeletal:     Left upper arm: Tenderness present. No swelling, deformity or bony tenderness.     Cervical back: No spinous process tenderness  or muscular tenderness.  Neurological:     Mental Status: He is alert.  Psychiatric:        Behavior: Behavior is cooperative.      UC Treatments / Results  Labs (all labs ordered are listed, but only abnormal results are displayed) Labs Reviewed  POCT URINALYSIS DIPSTICK, ED / UC - Abnormal; Notable for the following components:  Result Value   Hgb urine dipstick TRACE (*)    All other components within normal limits  URINE CULTURE    EKG   Radiology DG Abdomen 1 View  Result Date: 01/05/2021 CLINICAL DATA:  Lower abdominal pain for 1 week. EXAM: ABDOMEN - 1 VIEW COMPARISON:  05/06/2018 FINDINGS: Normal bowel gas pattern. No bowel dilatation or obstruction. Small volume of colonic stool. No radiopaque calculi or abnormal soft tissue calcifications. No concerning intraabdominal mass effect. Lung bases are clear. No acute osseous abnormalities are seen. IMPRESSION: Negative abdominal radiograph. Electronically Signed   By: Narda Rutherford M.D.   On: 01/05/2021 16:09    Procedures Procedures (including critical care time)  Medications Ordered in UC Medications - No data to display  Initial Impression / Assessment and Plan / UC Course  I have reviewed the triage vital signs and the nursing notes.  Pertinent labs & imaging results that were available during my care of the patient were reviewed by me and considered in my medical decision making (see chart for details).     Vital sign and physical exam reassuring today; no indication for emergent evaluation or imaging.  UA showed hemoglobin.  KUB obtained which was negative.  Urine culture obtained-results pending.  Given urinary symptoms with lower abdominal pain will treat with Keflex but discussed potential need to change antibiotics based on susceptibilities identified on culture.  He was encouraged to drink plenty of fluid.  Offered STI testing which patient declined.  Strict return precautions given to which patient  expressed understanding.  Concern for bicep injury given clinical presentation.  Patient was prescribed Naprosyn to the use for pain with instruction not to take additional NSAIDs including aspirin, ibuprofen/Advil, naproxen/Aleve with this medication due to risk of GI bleeding.  Recommended he follow-up with sports medicine for further evaluation and management.  Strict return precautions given to which patient expressed understanding.  Final Clinical Impressions(s) / UC Diagnoses   Final diagnoses:  Lower abdominal pain  Abnormal urinalysis  Left arm pain  Muscle weakness of left arm  Dysuria     Discharge Instructions     For your arm, take Naprosyn twice daily for pain relief.  Do not take additional NSAIDs including aspirin, ibuprofen/Advil, naproxen/Aleve with this medication due to risk of GI bleeding.  Follow-up with sports medicine as we discussed.  If you have any worsening symptoms please return for further evaluation.  We will treat for urinary tract infection given abnormal urine sensation and lower abdominal pain.  Take Keflex 3 times a day.  If we need to change this we will contact you.  If symptoms persist please return for further evaluation.    ED Prescriptions    Medication Sig Dispense Auth. Provider   cephALEXin (KEFLEX) 500 MG capsule Take 1 capsule (500 mg total) by mouth 3 (three) times daily. 21 capsule Kyrian Stage K, [name]   naproxen (NAPROSYN) 375 MG tablet Take 1 tablet (375 mg total) by mouth 2 (two) times daily. 20 tablet Demara Lover, Noberto Retort, [name]     PDMP not reviewed this encounter.   Jeani Hawking, [name] 01/05/21 1619

## 2021-01-05 NOTE — Discharge Instructions (Signed)
For your arm, take Naprosyn twice daily for pain relief.  Do not take additional NSAIDs including aspirin, ibuprofen/Advil, naproxen/Aleve with this medication due to risk of GI bleeding.  Follow-up with sports medicine as we discussed.  If you have any worsening symptoms please return for further evaluation.  We will treat for urinary tract infection given abnormal urine sensation and lower abdominal pain.  Take Keflex 3 times a day.  If we need to change this we will contact you.  If symptoms persist please return for further evaluation.

## 2021-01-05 NOTE — ED Triage Notes (Signed)
Pt here for intermittent abd pain onset 1 week.... sx also include nausea and polydipsia w/occasional urine incontinence.   Denies f/v/d  Also c/o left arm pain onset 1 month that now is radiating towards chest.... 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

## 2021-01-06 ENCOUNTER — Other Ambulatory Visit: Payer: Self-pay

## 2021-01-07 LAB — URINE CULTURE: Culture: NO GROWTH

## 2021-02-13 ENCOUNTER — Other Ambulatory Visit: Payer: Self-pay

## 2021-02-13 ENCOUNTER — Telehealth (INDEPENDENT_AMBULATORY_CARE_PROVIDER_SITE_OTHER): Payer: Self-pay

## 2021-02-13 ENCOUNTER — Ambulatory Visit (HOSPITAL_COMMUNITY)
Admission: EM | Admit: 2021-02-13 | Discharge: 2021-02-13 | Disposition: A | Discharge status: Home / Self Care | Payer: Self-pay | Attending: Emergency Medicine | Admitting: Emergency Medicine

## 2021-02-13 ENCOUNTER — Encounter (HOSPITAL_COMMUNITY): Payer: Self-pay

## 2021-02-13 DIAGNOSIS — S8392XA Sprain of unspecified site of left knee, initial encounter: Secondary | ICD-10-CM

## 2021-02-13 DIAGNOSIS — K649 Unspecified hemorrhoids: Secondary | ICD-10-CM

## 2021-02-13 MED ORDER — IBUPROFEN 800 MG PO TABS
800.0000 mg | ORAL_TABLET | Freq: Three times a day (TID) | ORAL | 0 refills | Status: DC | PRN
  Filled 2021-02-13: qty 21, 7d supply, fill #0

## 2021-02-13 MED ORDER — DICLOFENAC SODIUM 1 % EX GEL
2.0000 g | Freq: Four times a day (QID) | CUTANEOUS | 0 refills | Status: DC
  Filled 2021-02-13: qty 100, 13d supply, fill #0

## 2021-02-13 NOTE — Telephone Encounter (Signed)
Lab orders have been in since October. Please schedule him for fasting labs as soon as possible.

## 2021-02-13 NOTE — Discharge Instructions (Signed)
Take the Advil three times a day as needed for pain and swelling.    You can also use the Voltaren gel or Icy Hot for pain.   Rest as much as possible Ice for 10-15 minutes every 4-6 hours as needed for pain and swelling Compression- use an ace bandage or splint for comfort Elevate above your hip/heart when sitting and laying down  Follow up with sports medicine or orthopedics if symptoms do not improve in the next few days.   Hemorrhoids:  You can use Preparation H wipes or cream for comfort.   Follow up with general surgery for possible removal.    Return or go to the Emergency Department if symptoms worsen or do not improve in the next few days.

## 2021-02-13 NOTE — ED Triage Notes (Signed)
Pt c/o left knee pain and swelling X 3 days.  Pt states the work that he does has caused him to put too much pressure on his knees.  Pt states his knee hurts more when bending it.  Pt states he noticed blood in his stool this morning.

## 2021-02-13 NOTE — Telephone Encounter (Signed)
teCopied from CRM [id number]. Topic: General - Other >> Feb 13, 2021  8:40 AM Tamela Oddi wrote: Reason for CRM: Patient called to ask if there were any orders for him to get lab work. He stated the last time he came in the doctor told his to get his blood work.  He is not sure of when he is supposed to have this done.  Please advise and call patient to discuss.

## 2021-02-13 NOTE — ED Provider Notes (Signed)
[facility]    CSN: [id number] Arrival date & time: 02/13/21  1044      History   Chief Complaint Chief Complaint  Patient presents with  . Knee Pain  . Joint Swelling  . Blood In Stools    HPI Jeremy Randall is a 50 y.o. male.   Patient here for evaluation of left knee pain that has been ongoing for the past 3 days.  Reports taking some Advil PM last night which did help.  Reports pain is worse at night and with movement.  Reports history of knee injury about 10 years ago on the same knee.  Denies any recent injury but does report having a very physical job.  Also reports noticing some blood in stool this am.  Reports noticed blood after wiping and some on stool but denies any dark or tarry stools. Denies any trauma, injury, or other precipitating event.  Denies any fevers, chest pain, shortness of breath, N/V/D, numbness, tingling, weakness, abdominal pain, or headaches.    The history is provided by the patient.  Knee Pain   Past Medical History:  Diagnosis Date  . Asthma     Patient Active Problem List   Diagnosis Date Noted  . Pain, dental 05/26/2014  . H/O dental abscess 05/26/2014  . Gastritis 05/26/2014  . Smoking 05/26/2014  . Atrial fibrillation (HCC) 05/10/2012  . Abnormal finding on EKG 05/10/2012  . Nausea and vomiting 05/10/2012  . Prolonged Q-T interval on ECG 05/10/2012  . Hypokalemia 05/10/2012    Past Surgical History:  Procedure Laterality Date  . APPENDECTOMY    . PATELLAR TENDON REPAIR         Home Medications    Prior to Admission medications   Medication Sig Start Date End Date Taking? Authorizing Provider  diclofenac Sodium (VOLTAREN) 1 % GEL Apply 2 g topically 4 (four) times daily. 02/13/21  Yes Ivette Loyal, NP  ibuprofen (ADVIL) 800 MG tablet Take 1 tablet (800 mg total) by mouth 3 (three) times daily as needed. 02/13/21  Yes Ivette Loyal, NP  cephALEXin (KEFLEX) 500 MG capsule Take 1 capsule (500 mg total) by mouth 3  (three) times daily. 01/05/21   Raspet, Noberto Retort, [name]  ezetimibe (ZETIA) 10 MG tablet Take 1 tablet (10 mg total) by mouth daily. Patient not taking: Reported on 12/09/2019 06/23/19   Grayce Sessions, NP  naproxen (NAPROSYN) 375 MG tablet Take 1 tablet (375 mg total) by mouth 2 (two) times daily. 01/05/21   Raspet, Noberto Retort, [name]  sildenafil (VIAGRA) 100 MG tablet Take 0.5-1 tablets (50-100 mg total) by mouth daily as needed for erectile dysfunction. Patient not taking: Reported on 12/09/2019 06/22/19   Grayce Sessions, NP    Family History Family History  Problem Relation Age of Onset  . Diabetes Mother   . Hypertension Mother   . Cancer Sister   . Hypertension Sister   . Hypertension Father   . Diabetes Maternal Aunt     Social History Social History   Tobacco Use  . Smoking status: Current Every Day Smoker    Packs/day: 0.50    Years: 25.00    Pack years: 12.50    Types: Cigarettes  . Smokeless tobacco: Never Used  Vaping Use  . Vaping Use: Never used  Substance Use Topics  . Alcohol use: Yes    Comment: rare  . Drug use: Yes    Types: Marijuana    Comment: occ  Allergies   Patient has no known allergies.   Review of Systems Review of Systems  Gastrointestinal: Positive for blood in stool.  Musculoskeletal: Positive for arthralgias and joint swelling.  All other systems reviewed and are negative.    Physical Exam Triage Vital Signs ED Triage Vitals  Enc Vitals Group     BP 02/13/21 1158 137/86     Pulse Rate 02/13/21 1158 (!) 56     Resp 02/13/21 1158 18     Temp 02/13/21 1158 99.1 F (37.3 C)     Temp Source 02/13/21 1158 Oral     SpO2 02/13/21 1158 100 %     Weight --      Height --      Head Circumference --      Peak Flow --      Pain Score 02/13/21 1156 10     Pain Loc --      Pain Edu? --      Excl. in GC? --    No data found.  Updated Vital Signs BP 137/86 (BP Location: Right Arm)   Pulse (!) 56   Temp 99.1 F (37.3 C) (Oral)    Resp 18   SpO2 100%   Visual Acuity Right Eye Distance:   Left Eye Distance:   Bilateral Distance:    Right Eye Near:   Left Eye Near:    Bilateral Near:     Physical Exam Vitals and nursing note reviewed.  Constitutional:      General: He is not in acute distress.    Appearance: Normal appearance. He is not ill-appearing, toxic-appearing or diaphoretic.  HENT:     Head: Normocephalic and atraumatic.  Eyes:     Conjunctiva/sclera: Conjunctivae normal.  Cardiovascular:     Rate and Rhythm: Normal rate.     Pulses: Normal pulses.  Pulmonary:     Effort: Pulmonary effort is normal.  Abdominal:     General: Abdomen is flat.  Musculoskeletal:        General: Normal range of motion.     Cervical back: Normal range of motion.     Left knee: Swelling and bony tenderness present. No deformity, erythema or crepitus. Normal range of motion. No tenderness. Normal alignment, normal meniscus and normal patellar mobility. Normal pulse.     Instability Tests: Anterior drawer test negative. Posterior drawer test negative. Anterior Lachman test negative. Medial McMurray test negative and lateral McMurray test negative.  Skin:    General: Skin is warm and dry.  Neurological:     General: No focal deficit present.     Mental Status: He is alert and oriented to person, place, and time.  Psychiatric:        Mood and Affect: Mood normal.      UC Treatments / Results  Labs (all labs ordered are listed, but only abnormal results are displayed) Labs Reviewed - No data to display  EKG   Radiology No results found.  Procedures Procedures (including critical care time)  Medications Ordered in UC Medications - No data to display  Initial Impression / Assessment and Plan / UC Course  I have reviewed the triage vital signs and the nursing notes.  Pertinent labs & imaging results that were available during my care of the patient were reviewed by me and considered in my medical  decision making (see chart for details).    Assessment negative for red flags or concerns.  Knee pain, sprain of left knee.  Ibuprofen  and Voltaren gel prescribed for pain relief and comfort.  Recommend rest, ice, compression, and elevation.  Work note supplied.  Follow up with sports medicine or orthopedics if symptoms do not improve in the next few days. Hemorrhoids likely cause of rectal bleeding.  Preparation H wipes or creams for discomfort.  Follow up with general surgery for removal.  Go to the ED for any worsening symptoms.  Final Clinical Impressions(s) / UC Diagnoses   Final diagnoses:  Sprain of left knee, unspecified ligament, initial encounter  Hemorrhoids, unspecified hemorrhoid type     Discharge Instructions     Take the Advil three times a day as needed for pain and swelling.    You can also use the Voltaren gel or Icy Hot for pain.   Rest as much as possible Ice for 10-15 minutes every 4-6 hours as needed for pain and swelling Compression- use an ace bandage or splint for comfort Elevate above your hip/heart when sitting and laying down  Follow up with sports medicine or orthopedics if symptoms do not improve in the next few days.   Hemorrhoids:  You can use Preparation H wipes or cream for comfort.   Follow up with general surgery for possible removal.    Return or go to the Emergency Department if symptoms worsen or do not improve in the next few days.     ED Prescriptions    Medication Sig Dispense Auth. Provider   ibuprofen (ADVIL) 800 MG tablet Take 1 tablet (800 mg total) by mouth 3 (three) times daily as needed. 21 tablet Chales Salmon R, NP   diclofenac Sodium (VOLTAREN) 1 % GEL Apply 2 g topically 4 (four) times daily. 100 g Ivette Loyal, NP     PDMP not reviewed this encounter.   Ivette Loyal, NP 02/13/21 1248

## 2021-02-17 ENCOUNTER — Other Ambulatory Visit: Payer: Self-pay

## 2021-02-17 ENCOUNTER — Other Ambulatory Visit (INDEPENDENT_AMBULATORY_CARE_PROVIDER_SITE_OTHER): Payer: Self-pay

## 2021-02-17 DIAGNOSIS — E7800 Pure hypercholesterolemia, unspecified: Secondary | ICD-10-CM

## 2021-02-17 DIAGNOSIS — R351 Nocturia: Secondary | ICD-10-CM

## 2021-02-18 LAB — LIPID PANEL
Chol/HDL Ratio: 2.9 ratio (ref 0.0–5.0)
Cholesterol, Total: 159 mg/dL (ref 100–199)
HDL: 54 mg/dL (ref >39)
LDL Chol Calc (NIH): 92 mg/dL (ref 0–99)
Triglycerides: 65 mg/dL (ref 0–149)
VLDL Cholesterol Cal: 13 mg/dL (ref 5–40)

## 2021-02-18 LAB — PSA: Prostate Specific Ag, Serum: 1.5 ng/mL (ref 0.0–4.0)

## 2021-02-18 LAB — HEPATITIS C ANTIBODY: Hep C Virus Ab: 0.2 s/co ratio (ref 0.0–0.9)

## 2021-02-20 ENCOUNTER — Telehealth (INDEPENDENT_AMBULATORY_CARE_PROVIDER_SITE_OTHER): Payer: Self-pay

## 2021-02-20 ENCOUNTER — Other Ambulatory Visit: Payer: Self-pay

## 2021-02-20 NOTE — Telephone Encounter (Signed)
-----   Message from Grayce Sessions, NP sent at 02/20/2021  3:54 PM EDT ----- Labs normal PSA negative Hepatitis C

## 2021-02-20 NOTE — Telephone Encounter (Signed)
Patient is aware of normal/negative labs. Maryjean Morn, CMA

## 2021-06-04 ENCOUNTER — Ambulatory Visit (HOSPITAL_COMMUNITY)
Admission: EM | Admit: 2021-06-04 | Discharge: 2021-06-04 | Disposition: A | Discharge status: Home / Self Care | Payer: Self-pay | Attending: Student | Admitting: Student

## 2021-06-04 ENCOUNTER — Other Ambulatory Visit: Payer: Self-pay

## 2021-06-04 ENCOUNTER — Encounter (HOSPITAL_COMMUNITY): Payer: Self-pay | Admitting: Emergency Medicine

## 2021-06-04 DIAGNOSIS — K047 Periapical abscess without sinus: Secondary | ICD-10-CM

## 2021-06-04 MED ORDER — LIDOCAINE VISCOUS HCL 2 % MT SOLN: 15.0000 mL | OROMUCOSAL | 0 refills | Status: DC | PRN

## 2021-06-04 MED ORDER — AMOXICILLIN-POT CLAVULANATE 875-125 MG PO TABS: 1.0000 | ORAL_TABLET | Freq: Two times a day (BID) | ORAL | 0 refills | Status: DC

## 2021-06-04 NOTE — ED Triage Notes (Signed)
Right upper dental pain for several days.

## 2021-06-04 NOTE — ED Provider Notes (Signed)
[facility]    CSN: [id number] Arrival date & time: 06/04/21  1003      History   Chief Complaint Chief Complaint  Patient presents with   Dental Pain    HPI Jeremy Randall is a 50 y.o. male presenting with dental pain for about 3 days.  Medical history dental pain and dental abscess.  Also with history of A. fib, prolonged QT interval.  Describes right upper dental pain surrounding broken tooth for 3 days.  Not relieved with over-the-counter medications.  States he needs to get the tooth pulled, does have a dentist.  Denies foul taste in mouth, pain under tongue, pain under her jaw, sore throat, trouble swallowing, fever/chills.  HPI  Past Medical History:  Diagnosis Date   Asthma     Patient Active Problem List   Diagnosis Date Noted   Pain, dental 05/26/2014   H/O dental abscess 05/26/2014   Gastritis 05/26/2014   Smoking 05/26/2014   Atrial fibrillation (HCC) 05/10/2012   Abnormal finding on EKG 05/10/2012   Nausea and vomiting 05/10/2012   Prolonged Q-T interval on ECG 05/10/2012   Hypokalemia 05/10/2012    Past Surgical History:  Procedure Laterality Date   APPENDECTOMY     PATELLAR TENDON REPAIR         Home Medications    Prior to Admission medications   Medication Sig Start Date End Date Taking? Authorizing Provider  amoxicillin-clavulanate (AUGMENTIN) 875-125 MG tablet Take 1 tablet by mouth every 12 (twelve) hours. 06/04/21  Yes Rhys Martini, [name]  lidocaine (XYLOCAINE) 2 % solution Use as directed 15 mLs in the mouth or throat as needed for mouth pain. 06/04/21  Yes Rhys Martini, [name]  diclofenac Sodium (VOLTAREN) 1 % GEL Apply 2 g topically 4 (four) times daily. 02/13/21   Ivette Loyal, NP  ezetimibe (ZETIA) 10 MG tablet Take 1 tablet (10 mg total) by mouth daily. Patient not taking: Reported on 12/09/2019 06/23/19   Grayce Sessions, NP  ibuprofen (ADVIL) 800 MG tablet Take 1 tablet (800 mg total) by mouth 3 (three) times daily as  needed. 02/13/21   Ivette Loyal, NP  naproxen (NAPROSYN) 375 MG tablet Take 1 tablet (375 mg total) by mouth 2 (two) times daily. 01/05/21   Raspet, Noberto Retort, [name]  sildenafil (VIAGRA) 100 MG tablet Take 0.5-1 tablets (50-100 mg total) by mouth daily as needed for erectile dysfunction. Patient not taking: Reported on 12/09/2019 06/22/19   Grayce Sessions, NP    Family History Family History  Problem Relation Age of Onset   Diabetes Mother    Hypertension Mother    Cancer Sister    Hypertension Sister    Hypertension Father    Diabetes Maternal Aunt     Social History Social History   Tobacco Use   Smoking status: Every Day    Packs/day: 0.50    Years: 25.00    Pack years: 12.50    Types: Cigarettes   Smokeless tobacco: Never  Vaping Use   Vaping Use: Never used  Substance Use Topics   Alcohol use: Yes    Comment: rare   Drug use: Yes    Types: Marijuana    Comment: occ     Allergies   Patient has no known allergies.   Review of Systems Review of Systems  HENT:  Positive for dental problem.   All other systems reviewed and are negative.   Physical Exam Triage Vital Signs ED Triage  Vitals  Enc Vitals Group     BP 06/04/21 1018 (!) 159/106     Pulse Rate 06/04/21 1018 70     Resp 06/04/21 1018 17     Temp 06/04/21 1018 98.3 F (36.8 C)     Temp Source 06/04/21 1018 Oral     SpO2 06/04/21 1018 98 %     Weight --      Height --      Head Circumference --      Peak Flow --      Pain Score 06/04/21 1016 7     Pain Loc --      Pain Edu? --      Excl. in GC? --    No data found.  Updated Vital Signs BP (!) 159/106 (BP Location: Right Arm)   Pulse 70   Temp 98.3 F (36.8 C) (Oral)   Resp 17   SpO2 98%   Visual Acuity Right Eye Distance:   Left Eye Distance:   Bilateral Distance:    Right Eye Near:   Left Eye Near:    Bilateral Near:     Physical Exam Vitals reviewed.  Constitutional:      General: He is not in acute distress.     Appearance: Normal appearance. He is not ill-appearing, toxic-appearing or diaphoretic.  HENT:     Head: Normocephalic and atraumatic.     Jaw: There is normal jaw occlusion. No trismus, tenderness, swelling, pain on movement or malocclusion.     Salivary Glands: Right salivary gland is not diffusely enlarged or tender. Left salivary gland is not diffusely enlarged or tender.     Right Ear: Hearing normal.     Left Ear: Hearing normal.     Nose: Nose normal.     Mouth/Throat:     Lips: Pink.     Mouth: Mucous membranes are moist. No lacerations or oral lesions.     Dentition: Abnormal dentition. Does not have dentures. Dental tenderness, gingival swelling and dental caries present.     Tongue: No lesions. Tongue does not deviate from midline.     Palate: No mass.     Pharynx: Oropharynx is clear. Uvula midline. No oropharyngeal exudate or posterior oropharyngeal erythema.     Tonsils: No tonsillar exudate or tonsillar abscesses.     Comments: Poor dentician  Broken R upper molar with surrounding gingival tenderness. No trismus, drooling, sore throat, voice changes, swelling underneath the tongue, swelling underneath the jaw, neck stiffness.  Eyes:     Extraocular Movements: Extraocular movements intact.     Pupils: Pupils are equal, round, and reactive to light.  Pulmonary:     Effort: Pulmonary effort is normal.  Neurological:     General: No focal deficit present.     Mental Status: He is alert and oriented to person, place, and time.  Psychiatric:        Mood and Affect: Mood normal.        Behavior: Behavior normal.        Thought Content: Thought content normal.        Judgment: Judgment normal.     UC Treatments / Results  Labs (all labs ordered are listed, but only abnormal results are displayed) Labs Reviewed - No data to display  EKG   Radiology No results found.  Procedures Procedures (including critical care time)  Medications Ordered in UC Medications -  No data to display  Initial Impression / Assessment and Plan /  UC Course  I have reviewed the triage vital signs and the nursing notes.  Pertinent labs & imaging results that were available during my care of the patient were reviewed by me and considered in my medical decision making (see chart for details).     This patient is a very pleasant 50 y.o. year old male presenting with dental infection. Afebrile, nontachy. Augmentin, viscous lidocaine. Avoid NSAIDs given cardiac and gastritis history. F/u with dentist, patient does have one already. ED return precautions discussed. Patient verbalizes understanding and agreement.    Final Clinical Impressions(s) / UC Diagnoses   Final diagnoses:  Dental infection     Discharge Instructions      -Start the antibiotic-Augmentin (amoxicillin-clavulanate), 1 pill every 12 hours for 7 days.  You can take this with food like with breakfast and dinner. -For sore throat, use lidocaine mouthwash up to every 4 hours. Make sure not to eat for at least 1 hour after using this, as your mouth will be very numb and you could bite yourself. -Tylenol 1000mg  up to 3x daily -Follow-up with dentist    ED Prescriptions     Medication Sig Dispense Auth. Provider   lidocaine (XYLOCAINE) 2 % solution Use as directed 15 mLs in the mouth or throat as needed for mouth pain. 100 mL , [name]   amoxicillin-clavulanate (AUGMENTIN) 875-125 MG tablet Take 1 tablet by mouth every 12 (twelve) hours. 14 tablet Rhys Martini, [name]      PDMP not reviewed this encounter.   Rhys Martini, [name] 06/04/21 1123

## 2021-06-04 NOTE — Discharge Instructions (Addendum)
-  Start the antibiotic-Augmentin (amoxicillin-clavulanate), 1 pill every 12 hours for 7 days.  You can take this with food like with breakfast and dinner. -For sore throat, use lidocaine mouthwash up to every 4 hours. Make sure not to eat for at least 1 hour after using this, as your mouth will be very numb and you could bite yourself. -Tylenol 1000mg  up to 3x daily -Follow-up with dentist

## 2021-10-26 ENCOUNTER — Other Ambulatory Visit (HOSPITAL_BASED_OUTPATIENT_CLINIC_OR_DEPARTMENT_OTHER): Payer: Self-pay

## 2022-06-18 ENCOUNTER — Other Ambulatory Visit (HOSPITAL_COMMUNITY): Payer: Self-pay

## 2022-08-30 ENCOUNTER — Encounter (HOSPITAL_COMMUNITY): Payer: Self-pay

## 2022-08-30 ENCOUNTER — Other Ambulatory Visit: Payer: Self-pay

## 2022-08-30 ENCOUNTER — Ambulatory Visit (HOSPITAL_COMMUNITY)
Admission: EM | Admit: 2022-08-30 | Discharge: 2022-08-30 | Disposition: A | Discharge status: Home / Self Care | Payer: PRIVATE HEALTH INSURANCE | Attending: Physician Assistant | Admitting: Physician Assistant

## 2022-08-30 DIAGNOSIS — K0889 Other specified disorders of teeth and supporting structures: Secondary | ICD-10-CM

## 2022-08-30 DIAGNOSIS — K047 Periapical abscess without sinus: Secondary | ICD-10-CM

## 2022-08-30 MED ORDER — AMOXICILLIN-POT CLAVULANATE 875-125 MG PO TABS
1.0000 | ORAL_TABLET | Freq: Two times a day (BID) | ORAL | 0 refills | Status: DC
  Filled 2022-08-30: qty 14, 7d supply, fill #0

## 2022-08-30 MED ORDER — HYDROCODONE-ACETAMINOPHEN 5-325 MG PO TABS
1.0000 | ORAL_TABLET | Freq: Two times a day (BID) | ORAL | 0 refills | Status: DC | PRN
  Filled 2022-08-30: qty 2, 1d supply, fill #0

## 2022-08-30 NOTE — ED Triage Notes (Signed)
Pt is here for dental pain x1wk 

## 2022-08-30 NOTE — ED Provider Notes (Signed)
[facility]    CSN: [id number] Arrival date & time: 08/30/22  1034      History   Chief Complaint Chief Complaint  Patient presents with   Dental Pain    HPI Jeremy Randall is a 51 y.o. male.   Patient presents today with a weeklong history of left upper chest pain.  Reports this is gradually been worsening and is currently rated 8/9 on a 0-10 pain scale, described as throbbing, no aggravating relieving factors identified.  He has had similar episodes in the past that resolved with antibiotics.  Reports last episode was September 2022 and has not had additional antibiotics since that time.  He has been taking high-dose ibuprofen (600 to 800 mg) multiple times a day with minimal improvement of symptoms.  Reports that he has developed some irritation of his stomach since starting this medication.  He denies any difficulty breathing, swelling of his throat, muffled voice, shortness of breath, fever.  He has not seen a dentist recently.    Past Medical History:  Diagnosis Date   Asthma     Patient Active Problem List   Diagnosis Date Noted   Pain, dental 05/26/2014   H/O dental abscess 05/26/2014   Gastritis 05/26/2014   Smoking 05/26/2014   Atrial fibrillation (HCC) 05/10/2012   Abnormal finding on EKG 05/10/2012   Nausea and vomiting 05/10/2012   Prolonged Q-T interval on ECG 05/10/2012   Hypokalemia 05/10/2012    Past Surgical History:  Procedure Laterality Date   APPENDECTOMY     PATELLAR TENDON REPAIR         Home Medications    Prior to Admission medications   Medication Sig Start Date End Date Taking? Authorizing Provider  HYDROcodone-acetaminophen (NORCO/VICODIN) 5-325 MG tablet Take 1 tablet by mouth 2 (two) times daily as needed for up to 2 doses. 08/30/22  Yes Donnica Jarnagin K, [name]  amoxicillin-clavulanate (AUGMENTIN) 875-125 MG tablet Take 1 tablet by mouth every 12 (twelve) hours. 08/30/22   Pleas Carneal, Noberto Retort, [name]  ezetimibe (ZETIA) 10 MG tablet  Take 1 tablet (10 mg total) by mouth daily. Patient not taking: Reported on 12/09/2019 06/23/19   Grayce Sessions, NP  sildenafil (VIAGRA) 100 MG tablet Take 0.5-1 tablets (50-100 mg total) by mouth daily as needed for erectile dysfunction. Patient not taking: Reported on 12/09/2019 06/22/19   Grayce Sessions, NP    Family History Family History  Problem Relation Age of Onset   Diabetes Mother    Hypertension Mother    Cancer Sister    Hypertension Sister    Hypertension Father    Diabetes Maternal Aunt     Social History Social History   Tobacco Use   Smoking status: Every Day    Packs/day: 0.50    Years: 25.00    Total pack years: 12.50    Types: Cigarettes   Smokeless tobacco: Never  Vaping Use   Vaping Use: Never used  Substance Use Topics   Alcohol use: Yes    Comment: rare   Drug use: Yes    Types: Marijuana    Comment: occ     Allergies   Patient has no known allergies.   Review of Systems Review of Systems  Constitutional:  Positive for activity change. Negative for appetite change, fatigue and fever.  HENT:  Positive for dental problem and facial swelling. Negative for congestion, sinus pressure, sneezing, sore throat, trouble swallowing and voice change.   Respiratory:  Negative for cough and  shortness of breath.   Cardiovascular:  Negative for chest pain.  Gastrointestinal:  Negative for abdominal pain, diarrhea, nausea and vomiting.     Physical Exam Triage Vital Signs ED Triage Vitals  Enc Vitals Group     BP 08/30/22 1416 (!) 143/90     Pulse Rate 08/30/22 1416 60     Resp 08/30/22 1416 16     Temp 08/30/22 1416 98 F (36.7 C)     Temp Source 08/30/22 1416 Oral     SpO2 08/30/22 1416 97 %     Weight --      Height --      Head Circumference --      Peak Flow --      Pain Score 08/30/22 1414 8     Pain Loc --      Pain Edu? --      Excl. in GC? --    No data found.  Updated Vital Signs BP (!) 143/90 (BP Location: Left Arm)    Pulse 60   Temp 98 F (36.7 C) (Oral)   Resp 16   SpO2 97%   Visual Acuity Right Eye Distance:   Left Eye Distance:   Bilateral Distance:    Right Eye Near:   Left Eye Near:    Bilateral Near:     Physical Exam Vitals reviewed.  Constitutional:      General: He is awake.     Appearance: Normal appearance. He is well-developed. He is not ill-appearing.     Comments: Very pleasant male appears stated age in no acute distress sitting comfortably in exam room  HENT:     Head: Normocephalic and atraumatic.     Nose: Nose normal.     Mouth/Throat:     Dentition: Abnormal dentition. Gingival swelling and dental abscesses present.     Pharynx: Uvula midline. No oropharyngeal exudate or posterior oropharyngeal erythema.      Comments: No evidence of Ludwig angina Cardiovascular:     Rate and Rhythm: Normal rate and regular rhythm.     Heart sounds: Normal heart sounds, S1 normal and S2 normal. No murmur heard. Pulmonary:     Effort: Pulmonary effort is normal.     Breath sounds: Normal breath sounds. No stridor. No wheezing, rhonchi or rales.     Comments: Clear to auscultation bilaterally Neurological:     Mental Status: He is alert.  Psychiatric:        Behavior: Behavior is cooperative.      UC Treatments / Results  Labs (all labs ordered are listed, but only abnormal results are displayed) Labs Reviewed - No data to display  EKG   Radiology No results found.  Procedures Procedures (including critical care time)  Medications Ordered in UC Medications - No data to display  Initial Impression / Assessment and Plan / UC Course  I have reviewed the triage vital signs and the nursing notes.  Pertinent labs & imaging results that were available during my care of the patient were reviewed by me and considered in my medical decision making (see chart for details).     Patient is well-appearing, afebrile, nontoxic, nontachycardic.  No evidence of Ludwig angina  or drainable abscess on exam.  Will start outpatient antibiotics and he was given a prescription for Augmentin 875/125 twice daily for 7 days.  He can gargle with warm salt water and use over-the-counter medication for pain relief.  Discussed that ultimately he will need to see a  dentist and was given contact information for local provider with instruction to call to schedule appointment.  He was provided resources for low-cost dental care in the area.  Given history of gastritis with increasing abdominal discomfort related to high-dose NSAID use he was encouraged to minimize this medication and was given 2 doses of hydrocodone to manage pain while antibiotics take effect.  Review of [place] controlled substance database shows no inappropriate refills.  Discussed that we are unable to provide him a refill of this medication.  He is to rest and drink plenty of fluid.  Discussed that if his symptoms worsen anyway and he has swelling of his throat, worsening pain, fever, muffled voice, shortness of breath he needs to be seen immediately.  Strict return precautions given.  Work excuse note provided.  Final Clinical Impressions(s) / UC Diagnoses   Final diagnoses:  Dental infection  Dentalgia     Discharge Instructions      I believe you have an infection.  Use Augmentin twice daily for 7 days.  Gargle with warm salt water.  Use hydrocodone up to twice a day.  I will give you 2 doses unfortunately we are unable to provide a refill.  This can make you sleepy so do not drive or drink alcohol while taking it.  Follow-up with a dentist soon as possible.  Please call to schedule an appointment.  If you develop any worsening symptoms including high fever, difficulty swallowing, swelling of your throat, shortness of breath you need to be seen immediately.     ED Prescriptions     Medication Sig Dispense Auth. Provider   amoxicillin-clavulanate (AUGMENTIN) 875-125 MG tablet Take 1 tablet by mouth  every 12 (twelve) hours. 14 tablet Erick Murin K, [name]   HYDROcodone-acetaminophen (NORCO/VICODIN) 5-325 MG tablet Take 1 tablet by mouth 2 (two) times daily as needed for up to 2 doses. 2 tablet Frandy Basnett K, [name]      I have reviewed the PDMP during this encounter.   Jeani Hawking, [name] 08/30/22 1519

## 2022-08-30 NOTE — Discharge Instructions (Signed)
I believe you have an infection.  Use Augmentin twice daily for 7 days.  Gargle with warm salt water.  Use hydrocodone up to twice a day.  I will give you 2 doses unfortunately we are unable to provide a refill.  This can make you sleepy so do not drive or drink alcohol while taking it.  Follow-up with a dentist soon as possible.  Please call to schedule an appointment.  If you develop any worsening symptoms including high fever, difficulty swallowing, swelling of your throat, shortness of breath you need to be seen immediately.

## 2022-10-30 ENCOUNTER — Ambulatory Visit (INDEPENDENT_AMBULATORY_CARE_PROVIDER_SITE_OTHER): Payer: Self-pay | Admitting: *Deleted

## 2022-10-30 NOTE — Telephone Encounter (Signed)
Reason for Disposition  [1] MODERATE pain (e.g., interferes with normal activities) AND [2] pain comes and goes (cramps) AND [3] present > 24 hours  (Exception: Pain with Vomiting or Diarrhea - see that Guideline.)  Answer Assessment - Initial Assessment Questions 1. LOCATION: "Where does it hurt?"      I'm having pain in my upper stomach area that comes and goes.  Most top of my stomach. 2. RADIATION: "Does the pain shoot anywhere else?" (e.g., chest, back)     For a week and a half      I had a tooth situation going on and I was taking ibuprofen and Advil.     Mostly the ibuprofen.    3. ONSET: "When did the pain begin?" (Minutes, hours or days ago)      A week and a half now. 4. SUDDEN: "Gradual or sudden onset?"      5. PATTERN "Does the pain come and go, or is it constant?"    - If it comes and goes: "How long does it last?" "Do you have pain now?"     (Note: Comes and goes means the pain is intermittent. It goes away completely between bouts.)    - If constant: "Is it getting better, staying the same, or getting worse?"      (Note: Constant means the pain never goes away completely; most serious pain is constant and gets worse.)       6. SEVERITY: "How bad is the pain?"  (e.g., Scale 1-10; mild, moderate, or severe)    - MILD (1-3): Doesn't interfere with normal activities, abdomen soft and not tender to touch.     - MODERATE (4-7): Interferes with normal activities or awakens from sleep, abdomen tender to touch.     - SEVERE (8-10): Excruciating pain, doubled over, unable to do any normal activities.       7/10    I can't sleep due to it. 7. RECURRENT SYMPTOM: "Have you ever had this type of stomach pain before?" If Yes, ask: "When was the last time?" and "What happened that time?"      I have had this before but this time it has lasted longer.   I was taking BC Powders it they messed up my stomach. 8. CAUSE: "What do you think is causing the stomach pain?"     The ibuprofen I'm  taking for my tooth problem.    The tooth problem has been going on for 3 weeks.   I'm using Ambasol and Orajel for the tooth. 9. RELIEVING/AGGRAVATING FACTORS: "What makes it better or worse?" (e.g., antacids, bending or twisting motion, bowel movement)     No diarrhea or vomiting 10. OTHER SYMPTOMS: "Do you have any other symptoms?" (e.g., back pain, diarrhea, fever, urination pain, vomiting)  Protocols used: Abdominal Pain - Male-A-AH  Chief Complaint: upper abd pain Symptoms: above Frequency: a week and a half Pertinent Negatives: Patient denies diarrhea or vomiting Disposition: []$ ED /[]$ Urgent Care (no appt availability in office) / [x]$ Appointment(In office/virtual)/ []$  [facility]/ []$ Home Care/ []$ Refused Recommended Disposition /[]$ [facility]/ []$  Follow-up with PCP Additional Notes: Appt made with Juluis Mire, NP for 11/08/2022 at 1:30.

## 2022-11-08 ENCOUNTER — Telehealth (INDEPENDENT_AMBULATORY_CARE_PROVIDER_SITE_OTHER): Payer: PRIVATE HEALTH INSURANCE | Admitting: Primary Care

## 2022-11-08 DIAGNOSIS — R1010 Upper abdominal pain, unspecified: Secondary | ICD-10-CM | POA: Diagnosis not present

## 2022-11-08 DIAGNOSIS — K047 Periapical abscess without sinus: Secondary | ICD-10-CM

## 2022-11-08 NOTE — Progress Notes (Signed)
  [facility]  Virtual Visit via Telephone Note  I connected with Jeremy Randall, on 11/08/2022 at 2:41 PM  by telephone and verified that I am speaking with the correct person using two identifiers.   Consent: I discussed the limitations, risks, security and privacy concerns of performing an evaluation and management service by telephone and the availability of in person appointments. I also discussed with the patient that there may be a patient responsible charge related to this service. The patient expressed understanding and agreed to proceed.   Location of Patient: Home  Location of Provider: [facility]   Persons participating in Telemedicine visit: Vallarie Mare,  NP   History of Present Illness: Mr. Jeremy Randall is having a tele visit for upper  abdominal pains brought some Tums only worked a few days. He has a BM everyday if not every other day. Does drink coffee, alcohol , eats greasy foods and  taking Tylenol PM for a tooth pain every day. No hematochezia , no diarrhea . Admits to nausea and 1 episode of vomiting which made him feel better. Explained to patient this may because from infected too that is sensitive to hot and cold. Started drinking apple juice and made him feel worst.  Explained he was taking Gabriel Earing powers for a while and stopped because was told his stomach lining as mess up.   Past Medical History:  Diagnosis Date   Asthma    No Known Allergies  Current Outpatient Medications on File Prior to Visit  Medication Sig Dispense Refill   amoxicillin-clavulanate (AUGMENTIN) 875-125 MG tablet Take 1 tablet by mouth every 12 (twelve) hours. 14 tablet 0   ezetimibe (ZETIA) 10 MG tablet Take 1 tablet (10 mg total) by mouth daily. (Patient not taking: Reported on 12/09/2019) 90 tablet 0   HYDROcodone-acetaminophen (NORCO/VICODIN) 5-325 MG tablet Take 1 tablet by mouth 2 (two) times daily as  needed for up to 2 doses. 2 tablet 0   sildenafil (VIAGRA) 100 MG tablet Take 0.5-1 tablets (50-100 mg total) by mouth daily as needed for erectile dysfunction. (Patient not taking: Reported on 12/09/2019) 5 tablet 3   No current facility-administered medications on file prior to visit.    Observations/Objective: left side bottom last tooth See HPI   Assessment and Plan: Diagnoses and all orders for this visit:  Infected tooth 2/2 Pain of upper abdomen Overuse of Goody powders and needs to see a dentist     Follow Up Instructions: Go to dentist   I discussed the assessment and treatment plan with the patient. The patient was provided an opportunity to ask questions and all were answered. The patient agreed with the plan and demonstrated an understanding of the instructions.   The patient was advised to call back or seek an in-person evaluation if the symptoms worsen or if the condition fails to improve as anticipated.     I provided 15 minutes total of non-face-to-face time during this encounter including median intraservice time, reviewing previous notes, investigations, ordering medications, medical decision making, coordinating care and patient verbalized understanding at the end of the visit.    This note has been created with Surveyor, quantity. Any transcriptional errors are unintentional.   Kerin Perna, NP 11/08/2022, 2:41 PM

## 2022-11-20 ENCOUNTER — Emergency Department (HOSPITAL_COMMUNITY): Payer: PRIVATE HEALTH INSURANCE

## 2022-11-20 ENCOUNTER — Other Ambulatory Visit: Payer: Self-pay

## 2022-11-20 ENCOUNTER — Emergency Department (HOSPITAL_COMMUNITY)
Admission: EM | Admit: 2022-11-20 | Discharge: 2022-11-20 | Disposition: A | Discharge status: Home / Self Care | Payer: PRIVATE HEALTH INSURANCE | Attending: Emergency Medicine | Admitting: Emergency Medicine

## 2022-11-20 DIAGNOSIS — K219 Gastro-esophageal reflux disease without esophagitis: Secondary | ICD-10-CM | POA: Insufficient documentation

## 2022-11-20 DIAGNOSIS — K5900 Constipation, unspecified: Secondary | ICD-10-CM | POA: Insufficient documentation

## 2022-11-20 DIAGNOSIS — R1013 Epigastric pain: Secondary | ICD-10-CM | POA: Diagnosis present

## 2022-11-20 LAB — COMPREHENSIVE METABOLIC PANEL
ALT: 9 U/L (ref 0–44)
AST: 17 U/L (ref 15–41)
Albumin: 3.8 g/dL (ref 3.5–5.0)
Alkaline Phosphatase: 75 U/L (ref 38–126)
Anion gap: 11 (ref 5–15)
BUN: 9 mg/dL (ref 6–20)
CO2: 26 mmol/L (ref 22–32)
Calcium: 9.3 mg/dL (ref 8.9–10.3)
Chloride: 101 mmol/L (ref 98–111)
Creatinine, Ser: 1.04 mg/dL (ref 0.61–1.24)
GFR, Estimated: >60 mL/min (ref >60)
Glucose, Bld: 109 mg/dL — ABNORMAL HIGH (ref 70–99)
Potassium: 3.5 mmol/L (ref 3.5–5.1)
Sodium: 138 mmol/L (ref 135–145)
Total Bilirubin: <0.1 mg/dL — ABNORMAL LOW (ref 0.3–1.2)
Total Protein: 7.2 g/dL (ref 6.5–8.1)

## 2022-11-20 LAB — URINALYSIS, ROUTINE W REFLEX MICROSCOPIC
Bilirubin Urine: NEGATIVE
Glucose, UA: NEGATIVE mg/dL
Hgb urine dipstick: NEGATIVE
Ketones, ur: NEGATIVE mg/dL
Leukocytes,Ua: NEGATIVE
Nitrite: NEGATIVE
Protein, ur: NEGATIVE mg/dL
Specific Gravity, Urine: 1.025 (ref 1.005–1.030)
pH: 7 (ref 5.0–8.0)

## 2022-11-20 LAB — CBC
HCT: 45 % (ref 39.0–52.0)
Hemoglobin: 14.9 g/dL (ref 13.0–17.0)
MCH: 29.6 pg (ref 26.0–34.0)
MCHC: 33.1 g/dL (ref 30.0–36.0)
MCV: 89.3 fL (ref 80.0–100.0)
Platelets: 271 10*3/uL (ref 150–400)
RBC: 5.04 MIL/uL (ref 4.22–5.81)
RDW: 13.2 % (ref 11.5–15.5)
WBC: 7.7 10*3/uL (ref 4.0–10.5)
nRBC: 0 % (ref 0.0–0.2)

## 2022-11-20 LAB — LIPASE, BLOOD: Lipase: 40 U/L (ref 11–51)

## 2022-11-20 MED ORDER — OMEPRAZOLE 20 MG PO CPDR
20.0000 mg | DELAYED_RELEASE_CAPSULE | Freq: Every day | ORAL | 0 refills | Status: DC
  Filled 2022-11-20 – 2023-01-11 (×2): qty 30, 30d supply, fill #0

## 2022-11-20 MED ORDER — DICYCLOMINE HCL 10 MG/ML IM SOLN
20.0000 mg | Freq: Once | INTRAMUSCULAR | Status: AC
  Administered 2022-11-20: 20 mg via INTRAMUSCULAR
  Filled 2022-11-20: qty 2

## 2022-11-20 MED ORDER — FAMOTIDINE 20 MG PO TABS
20.0000 mg | ORAL_TABLET | Freq: Once | ORAL | Status: AC
  Administered 2022-11-20: 20 mg via ORAL
  Filled 2022-11-20: qty 1

## 2022-11-20 MED ORDER — ALUM & MAG HYDROXIDE-SIMETH 200-200-20 MG/5ML PO SUSP
30.0000 mL | Freq: Once | ORAL | Status: AC
  Administered 2022-11-20: 30 mL via ORAL
  Filled 2022-11-20: qty 30

## 2022-11-20 NOTE — ED Provider Notes (Signed)
[facility] Provider Note   CSN: [id number] Arrival date & time: 11/20/22  0016     History  Chief Complaint  Patient presents with   Abdominal Pain    Jeremy Randall is a 52 y.o. male.  The history is provided by the patient.  Abdominal Pain Pain location:  Epigastric Pain quality: burning   Pain radiates to:  Does not radiate Pain severity:  Moderate Timing:  Intermittent Progression:  Waxing and waning Chronicity:  New Context: eating   Context comment:  Spaghetti and worse nightly Relieved by:  Nothing Worsened by:  Nothing Ineffective treatments:  None tried Associated symptoms: no chest pain, no diarrhea, no fever, no shortness of breath and no vomiting   Risk factors: has not had multiple surgeries        Home Medications Prior to Admission medications   Medication Sig Start Date End Date Taking? Authorizing Provider  omeprazole (PRILOSEC) 20 MG capsule Take 1 capsule (20 mg total) by mouth daily. 11/20/22  Yes Shirlena Brinegar, MD  amoxicillin-clavulanate (AUGMENTIN) 875-125 MG tablet Take 1 tablet by mouth every 12 (twelve) hours. 08/30/22   Raspet, Derry Skill, [name]  HYDROcodone-acetaminophen (NORCO/VICODIN) 5-325 MG tablet Take 1 tablet by mouth 2 (two) times daily as needed for up to 2 doses. 08/30/22   Raspet, Derry Skill, [name]      Allergies    Patient has no known allergies.    Review of Systems   Review of Systems  Constitutional:  Negative for fever.  HENT:  Negative for facial swelling.   Eyes:  Negative for redness.  Respiratory:  Negative for shortness of breath.   Cardiovascular:  Negative for chest pain.  Gastrointestinal:  Positive for abdominal pain. Negative for diarrhea and vomiting.  All other systems reviewed and are negative.   Physical Exam Updated Vital Signs BP (!) 155/101 (BP Location: Right Arm)   Pulse 64   Temp 98.2 F (36.8 C)   Resp 16   SpO2 97%  Physical Exam Vitals and nursing  note reviewed.  Constitutional:      General: He is not in acute distress.    Appearance: He is well-developed. He is not diaphoretic.  HENT:     Head: Normocephalic and atraumatic.     Nose: Nose normal.  Eyes:     Conjunctiva/sclera: Conjunctivae normal.     Pupils: Pupils are equal, round, and reactive to light.  Cardiovascular:     Rate and Rhythm: Normal rate and regular rhythm.  Pulmonary:     Effort: Pulmonary effort is normal.     Breath sounds: Normal breath sounds. No wheezing or rales.  Abdominal:     General: Abdomen is flat. Bowel sounds are normal.     Palpations: Abdomen is soft. There is no mass.     Tenderness: There is no abdominal tenderness. There is no guarding or rebound.     Hernia: No hernia is present.     Comments: Stool palpable   Musculoskeletal:        General: Normal range of motion.     Cervical back: Normal range of motion and neck supple.  Skin:    General: Skin is warm and dry.     Capillary Refill: Capillary refill takes less than 2 seconds.  Neurological:     General: No focal deficit present.     Mental Status: He is alert and oriented to person, place, and time.  Deep Tendon Reflexes: Reflexes normal.  Psychiatric:        Mood and Affect: Mood normal.        Behavior: Behavior normal.     ED Results / Procedures / Treatments   Labs (all labs ordered are listed, but only abnormal results are displayed) Results for orders placed or performed during the hospital encounter of 11/20/22  Lipase, blood  Result Value Ref Range   Lipase 40 11 - 51 U/L  Comprehensive metabolic panel  Result Value Ref Range   Sodium 138 135 - 145 mmol/L   Potassium 3.5 3.5 - 5.1 mmol/L   Chloride 101 98 - 111 mmol/L   CO2 26 22 - 32 mmol/L   Glucose, Bld 109 (H) 70 - 99 mg/dL   BUN 9 6 - 20 mg/dL   Creatinine, Ser 1.04 0.61 - 1.24 mg/dL   Calcium 9.3 8.9 - 10.3 mg/dL   Total Protein 7.2 6.5 - 8.1 g/dL   Albumin 3.8 3.5 - 5.0 g/dL   AST 17 15 - 41  U/L   ALT 9 0 - 44 U/L   Alkaline Phosphatase 75 38 - 126 U/L   Total Bilirubin <0.1 (L) 0.3 - 1.2 mg/dL   GFR, Estimated >60 >60 mL/min   Anion gap 11 5 - 15  CBC  Result Value Ref Range   WBC 7.7 4.0 - 10.5 K/uL   RBC 5.04 4.22 - 5.81 MIL/uL   Hemoglobin 14.9 13.0 - 17.0 g/dL   HCT 45.0 39.0 - 52.0 %   MCV 89.3 80.0 - 100.0 fL   MCH 29.6 26.0 - 34.0 pg   MCHC 33.1 30.0 - 36.0 g/dL   RDW 13.2 11.5 - 15.5 %   Platelets 271 150 - 400 K/uL   nRBC 0.0 0.0 - 0.2 %  Urinalysis, Routine w reflex microscopic -Urine, Clean Catch  Result Value Ref Range   Color, Urine YELLOW YELLOW   APPearance CLEAR CLEAR   Specific Gravity, Urine 1.025 1.005 - 1.030   pH 7.0 5.0 - 8.0   Glucose, UA NEGATIVE NEGATIVE mg/dL   Hgb urine dipstick NEGATIVE NEGATIVE   Bilirubin Urine NEGATIVE NEGATIVE   Ketones, ur NEGATIVE NEGATIVE mg/dL   Protein, ur NEGATIVE NEGATIVE mg/dL   Nitrite NEGATIVE NEGATIVE   Leukocytes,Ua NEGATIVE NEGATIVE   DG Abdomen Acute W/Chest  Result Date: 11/20/2022 CLINICAL DATA:  Abdominal pain for a few days EXAM: DG ABDOMEN ACUTE WITH 1 VIEW CHEST COMPARISON:  01/05/2021 FINDINGS: There is no evidence of dilated bowel loops or free intraperitoneal air. No calculus or concerning mass effect. Calcifications in the pelvis appear vascular. Heart size and mediastinal contours are within normal limits. Both lungs are clear. IMPRESSION: Negative abdominal radiographs.  No acute cardiopulmonary disease. Electronically Signed   By: Jorje Guild M.D.   On: 11/20/2022 05:44    EKG EKG Interpretation  Date/Time:  Tuesday November 20 2022 00:22:06 EDT Ventricular Rate:  68 PR Interval:  136 QRS Duration: 98 QT Interval:  394 QTC Calculation: 418 R Axis:   89 Text Interpretation: Normal sinus rhythm Early repolarization Confirmed by Randal Buba, Christpoher Sievers ([id number]) on 11/20/2022 5:09:47 AM  Radiology DG Abdomen Acute W/Chest  Result Date: 11/20/2022 CLINICAL DATA:  Abdominal pain for a few days  EXAM: DG ABDOMEN ACUTE WITH 1 VIEW CHEST COMPARISON:  01/05/2021 FINDINGS: There is no evidence of dilated bowel loops or free intraperitoneal air. No calculus or concerning mass effect. Calcifications in the pelvis appear vascular. Heart size  and mediastinal contours are within normal limits. Both lungs are clear. IMPRESSION: Negative abdominal radiographs.  No acute cardiopulmonary disease. Electronically Signed   By: Jorje Guild M.D.   On: 11/20/2022 05:44    Procedures Procedures    Medications Ordered in ED Medications  alum & mag hydroxide-simeth (MAALOX/MYLANTA) 200-200-20 MG/5ML suspension 30 mL (has no administration in time range)  famotidine (PEPCID) tablet 20 mg (has no administration in time range)  dicyclomine (BENTYL) injection 20 mg (has no administration in time range)    ED Course/ Medical Decision Making/ A&P                             Medical Decision Making Patient with epigastric pain daily worse at night, tonight after eating spaghetti   Amount and/or Complexity of Data Reviewed External Data Reviewed: notes.    Details: Previous notes reviewed  Labs: ordered.    Details: All labs reviewed: urine is without UTI.  Normal white count 7.7, normal hemoglobin 14.9, normal platelet count.  Normal sodium 138, normal potassium 3.5, normal creatinine 1.04, normal LFTs, normal lipase 40 Radiology: ordered and independent interpretation performed.    Details: Constipation by me on XR ECG/medicine tests: ordered and independent interpretation performed. Decision-making details documented in ED Course.  Risk OTC drugs. Prescription drug management. Risk Details: Exam and vital and labs and imaging are benign and reassuring.  History and exam are consistent with GERD.  WIll initiate gerd friendly diet and PPI.  Follow up with your PMD for ongoing care.  Start miralax for constipation.  Strict return .      Final Clinical Impression(s) / ED Diagnoses Final diagnoses:   Gastroesophageal reflux disease, unspecified whether esophagitis present  Constipation, unspecified constipation type   Return for intractable cough, coughing up blood, fevers > 100.4 unrelieved by medication, shortness of breath, intractable vomiting, chest pain, shortness of breath, weakness, numbness, changes in speech, facial asymmetry, abdominal pain, passing out, Inability to tolerate liquids or food, cough, altered mental status or any concerns. No signs of systemic illness or infection. The patient is nontoxic-appearing on exam and vital signs are within normal limits.  I have reviewed the triage vital signs and the nursing notes. Pertinent labs & imaging results that were available during my care of the patient were reviewed by me and considered in my medical decision making (see chart for details). After history, exam, and medical workup I feel the patient has been appropriately medically screened and is safe for discharge home. Pertinent diagnoses were discussed with the patient. Patient was given return precautions.  Rx / DC Orders ED Discharge Orders          Ordered    omeprazole (PRILOSEC) 20 MG capsule  Daily        11/20/22 0559              Kimmy Parish, MD 11/20/22 [id number]

## 2022-11-20 NOTE — ED Triage Notes (Signed)
Patient reports intermittent generalized abdominal pain with emesis for 2 weeks . No diarrhea or fever .

## 2022-11-27 ENCOUNTER — Other Ambulatory Visit (INDEPENDENT_AMBULATORY_CARE_PROVIDER_SITE_OTHER): Payer: PRIVATE HEALTH INSURANCE

## 2022-11-30 ENCOUNTER — Other Ambulatory Visit (INDEPENDENT_AMBULATORY_CARE_PROVIDER_SITE_OTHER): Payer: PRIVATE HEALTH INSURANCE

## 2022-11-30 DIAGNOSIS — Z131 Encounter for screening for diabetes mellitus: Secondary | ICD-10-CM

## 2022-11-30 DIAGNOSIS — Z13220 Encounter for screening for lipoid disorders: Secondary | ICD-10-CM

## 2022-12-01 LAB — LIPID PANEL
Chol/HDL Ratio: 3.5 ratio (ref 0.0–5.0)
Cholesterol, Total: 208 mg/dL — ABNORMAL HIGH (ref 100–199)
HDL: 60 mg/dL (ref >39)
LDL Chol Calc (NIH): 132 mg/dL — ABNORMAL HIGH (ref 0–99)
Triglycerides: 92 mg/dL (ref 0–149)
VLDL Cholesterol Cal: 16 mg/dL (ref 5–40)

## 2022-12-01 LAB — HEMOGLOBIN A1C
Est. average glucose Bld gHb Est-mCnc: 126 mg/dL
Hgb A1c MFr Bld: 6 % — ABNORMAL HIGH (ref 4.8–5.6)

## 2022-12-04 ENCOUNTER — Telehealth: Payer: Self-pay | Admitting: Primary Care

## 2022-12-04 DIAGNOSIS — E782 Mixed hyperlipidemia: Secondary | ICD-10-CM

## 2022-12-04 NOTE — Telephone Encounter (Signed)
Copied from [place] [id number]. Topic: General - Other >> Dec 04, 2022  2:57 PM Jacinto Reap M wrote: Reason for CRM: Pt called for lab results. Pt requests call back at [phone]

## 2022-12-05 ENCOUNTER — Other Ambulatory Visit: Payer: Self-pay

## 2022-12-05 ENCOUNTER — Telehealth (INDEPENDENT_AMBULATORY_CARE_PROVIDER_SITE_OTHER): Payer: Self-pay

## 2022-12-05 ENCOUNTER — Encounter (INDEPENDENT_AMBULATORY_CARE_PROVIDER_SITE_OTHER): Payer: Self-pay

## 2022-12-05 MED ORDER — PRAVASTATIN SODIUM 20 MG PO TABS
20.0000 mg | ORAL_TABLET | Freq: Every day | ORAL | 3 refills | Status: DC
  Filled 2022-12-05 – 2023-01-11 (×2): qty 90, 90d supply, fill #0

## 2022-12-05 NOTE — Telephone Encounter (Signed)
Please result lab for pt

## 2022-12-05 NOTE — Telephone Encounter (Signed)
Sent pt a MyChart message

## 2022-12-05 NOTE — Telephone Encounter (Signed)
Pt had called in to [place], [place] and hung up before transferring to [place]. Called pt back, pt given lab results per notes of Sharyn Lull, NP on 12/05/22. Pt verbalized understanding and had no further questions/concerns noted.

## 2022-12-05 NOTE — Telephone Encounter (Signed)
Your A1c is 6.0 remain prediabetic.  Monitor your carbohydrates which include rice, potatoes, macaroni and cheese, breads, sweets, and sodas.  No medication required at this time just lifestyle modification to include exercising. Your cholesterol higher than expected. High cholesterol may increase risk of heart attack and/or stroke. Consider eating more fruits, vegetables, and lean baked meats such as chicken or fish. Moderate intensity exercise at least 150 minutes as tolerated per week may help as well.  Will send in a cholesterol medication take at night recheck your cholesterol in 3 months pravastatin 20mg  .

## 2022-12-05 NOTE — Addendum Note (Signed)
Addended by: Juluis Mire on: 12/05/2022 10:59 AM   Modules accepted: Orders

## 2022-12-10 ENCOUNTER — Telehealth: Payer: Self-pay

## 2022-12-10 NOTE — Telephone Encounter (Signed)
Copied from [place] [phone]. Topic: General - Other >> Dec 07, 2022  1:50 PM Denman George T wrote: Reason for CRM: Patient called in to see if he can get the nurse to call and give him his restrictive diet options as well as text or upload it to mychart.

## 2022-12-10 NOTE — Telephone Encounter (Signed)
Returned pt call and made aware that I sent him a MyChart message on 3/27 with his labs and diet. Pt states he will go on MyChart message and check it out. Pt doesn't have any other questions or concerns

## 2022-12-12 ENCOUNTER — Other Ambulatory Visit: Payer: Self-pay

## 2023-01-11 ENCOUNTER — Other Ambulatory Visit: Payer: Self-pay

## 2023-09-08 ENCOUNTER — Encounter (HOSPITAL_BASED_OUTPATIENT_CLINIC_OR_DEPARTMENT_OTHER): Payer: Self-pay | Admitting: Emergency Medicine

## 2023-09-08 ENCOUNTER — Other Ambulatory Visit: Payer: Self-pay

## 2023-09-08 ENCOUNTER — Emergency Department (HOSPITAL_BASED_OUTPATIENT_CLINIC_OR_DEPARTMENT_OTHER)
Admission: EM | Admit: 2023-09-08 | Discharge: 2023-09-08 | Disposition: A | Discharge status: Home / Self Care | Payer: Self-pay

## 2023-09-08 ENCOUNTER — Emergency Department (HOSPITAL_BASED_OUTPATIENT_CLINIC_OR_DEPARTMENT_OTHER): Payer: Self-pay

## 2023-09-08 DIAGNOSIS — R1084 Generalized abdominal pain: Secondary | ICD-10-CM | POA: Insufficient documentation

## 2023-09-08 DIAGNOSIS — R112 Nausea with vomiting, unspecified: Secondary | ICD-10-CM | POA: Insufficient documentation

## 2023-09-08 DIAGNOSIS — Z20822 Contact with and (suspected) exposure to covid-19: Secondary | ICD-10-CM | POA: Insufficient documentation

## 2023-09-08 LAB — COMPREHENSIVE METABOLIC PANEL
ALT: 8 U/L (ref 0–44)
AST: 14 U/L — ABNORMAL LOW (ref 15–41)
Albumin: 4.7 g/dL (ref 3.5–5.0)
Alkaline Phosphatase: 78 U/L (ref 38–126)
Anion gap: 9 (ref 5–15)
BUN: 13 mg/dL (ref 6–20)
CO2: 28 mmol/L (ref 22–32)
Calcium: 9.7 mg/dL (ref 8.9–10.3)
Chloride: 99 mmol/L (ref 98–111)
Creatinine, Ser: 1.2 mg/dL (ref 0.61–1.24)
GFR, Estimated: >60 mL/min (ref >60)
Glucose, Bld: 106 mg/dL — ABNORMAL HIGH (ref 70–99)
Potassium: 3.7 mmol/L (ref 3.5–5.1)
Sodium: 136 mmol/L (ref 135–145)
Total Bilirubin: 0.5 mg/dL (ref <1.2)
Total Protein: 8.3 g/dL — ABNORMAL HIGH (ref 6.5–8.1)

## 2023-09-08 LAB — URINALYSIS, ROUTINE W REFLEX MICROSCOPIC
Bilirubin Urine: NEGATIVE
Glucose, UA: NEGATIVE mg/dL
Ketones, ur: 15 mg/dL — AB
Leukocytes,Ua: NEGATIVE
Nitrite: NEGATIVE
Protein, ur: 30 mg/dL — AB
Specific Gravity, Urine: 1.038 — ABNORMAL HIGH (ref 1.005–1.030)
pH: 5.5 (ref 5.0–8.0)

## 2023-09-08 LAB — LIPASE, BLOOD: Lipase: 25 U/L (ref 11–51)

## 2023-09-08 LAB — CBC
HCT: 45.3 % (ref 39.0–52.0)
Hemoglobin: 15.7 g/dL (ref 13.0–17.0)
MCH: 30.2 pg (ref 26.0–34.0)
MCHC: 34.7 g/dL (ref 30.0–36.0)
MCV: 87.1 fL (ref 80.0–100.0)
Platelets: 230 10*3/uL (ref 150–400)
RBC: 5.2 MIL/uL (ref 4.22–5.81)
RDW: 13.2 % (ref 11.5–15.5)
WBC: 8.1 10*3/uL (ref 4.0–10.5)
nRBC: 0 % (ref 0.0–0.2)

## 2023-09-08 LAB — RESP PANEL BY RT-PCR (RSV, FLU A&B, COVID)  RVPGX2
Influenza A by PCR: NEGATIVE
Influenza B by PCR: NEGATIVE
Resp Syncytial Virus by PCR: NEGATIVE
SARS Coronavirus 2 by RT PCR: NEGATIVE

## 2023-09-08 MED ORDER — ONDANSETRON 4 MG PO TBDP
4.0000 mg | ORAL_TABLET | Freq: Three times a day (TID) | ORAL | 0 refills | Status: DC | PRN
  Filled 2023-09-08: qty 20, 7d supply, fill #0

## 2023-09-08 MED ORDER — SODIUM CHLORIDE 0.9 % IV BOLUS
1000.0000 mL | Freq: Once | INTRAVENOUS | Status: AC
  Administered 2023-09-08: 1000 mL via INTRAVENOUS

## 2023-09-08 MED ORDER — IOHEXOL 300 MG/ML  SOLN
100.0000 mL | Freq: Once | INTRAMUSCULAR | Status: AC | PRN
  Administered 2023-09-08: 100 mL via INTRAVENOUS

## 2023-09-08 NOTE — ED Provider Notes (Cosign Needed)
[place] EMERGENCY DEPARTMENT AT [facility] Provider Note   CSN: [id number] Arrival date & time: 09/08/23  1636     History  Chief Complaint  Patient presents with   Emesis    Jeremy Randall is a 52 y.o. male.  Patient complains of nausea and vomiting for several days.  Patient reports that he has a past medical history of gastritis and takes omeprazole.  Patient reports that he has been taking on and off for several months.  Patient reports today he has felt weak and tired.  Patient reports more abdominal pain than he has experienced previously.Patient denies any tarry or black looking stool.  The history is provided by the patient. No language interpreter was used.  Emesis Timing:  Constant Progression:  Worsening      Home Medications Prior to Admission medications   Medication Sig Start Date End Date Taking? Authorizing Provider  ondansetron (ZOFRAN-ODT) 4 MG disintegrating tablet Take 1 tablet (4 mg total) by mouth every 8 (eight) hours as needed for nausea or vomiting. 09/08/23  Yes Cheron Schaumann K, [name]  amoxicillin-clavulanate (AUGMENTIN) 875-125 MG tablet Take 1 tablet by mouth every 12 (twelve) hours. 08/30/22   Raspet, Noberto Retort, [name]  HYDROcodone-acetaminophen (NORCO/VICODIN) 5-325 MG tablet Take 1 tablet by mouth 2 (two) times daily as needed for up to 2 doses. 08/30/22   Raspet, Noberto Retort, [name]  omeprazole (PRILOSEC) 20 MG capsule Take 1 capsule (20 mg total) by mouth daily. 11/20/22   Palumbo, April, MD  pravastatin (PRAVACHOL) 20 MG tablet Take 1 tablet (20 mg total) by mouth daily. 12/05/22   Grayce Sessions, NP      Allergies    Patient has no known allergies.    Review of Systems   Review of Systems  Gastrointestinal:  Positive for vomiting.  All other systems reviewed and are negative.   Physical Exam Updated Vital Signs BP (!) 140/90   Pulse 64   Temp 99.5 F (37.5 C)   Resp 18   SpO2 100%  Physical Exam Vitals and nursing note  reviewed.  Constitutional:      Appearance: He is well-developed.  HENT:     Head: Normocephalic.  Cardiovascular:     Rate and Rhythm: Normal rate.  Pulmonary:     Effort: Pulmonary effort is normal.  Abdominal:     General: There is no distension.  Musculoskeletal:        General: Normal range of motion.     Cervical back: Normal range of motion.  Skin:    General: Skin is warm.  Neurological:     General: No focal deficit present.     Mental Status: He is alert and oriented to person, place, and time.     ED Results / Procedures / Treatments   Labs (all labs ordered are listed, but only abnormal results are displayed) Labs Reviewed  COMPREHENSIVE METABOLIC PANEL - Abnormal; Notable for the following components:      Result Value   Glucose, Bld 106 (*)    Total Protein 8.3 (*)    AST 14 (*)    All other components within normal limits  URINALYSIS, ROUTINE W REFLEX MICROSCOPIC - Abnormal; Notable for the following components:   Specific Gravity, Urine 1.038 (*)    Hgb urine dipstick TRACE (*)    Ketones, ur 15 (*)    Protein, ur 30 (*)    Bacteria, UA RARE (*)    All other components within normal limits  RESP PANEL BY RT-PCR (RSV, FLU A&B, COVID)  RVPGX2  LIPASE, BLOOD  CBC    EKG None  Radiology CT ABDOMEN PELVIS W CONTRAST Result Date: 09/08/2023 CLINICAL DATA:  Acute abdominal pain with nausea and vomiting, initial encounter EXAM: CT ABDOMEN AND PELVIS WITH CONTRAST TECHNIQUE: Multidetector CT imaging of the abdomen and pelvis was performed using the standard protocol following bolus administration of intravenous contrast. RADIATION DOSE REDUCTION: This exam was performed according to the departmental dose-optimization program which includes automated exposure control, adjustment of the mA and/or kV according to patient size and/or use of iterative reconstruction technique. CONTRAST:  OMNIPAQUE IOHEXOL 300 MG/ML  SOLN COMPARISON:  None Available. FINDINGS:  Lower chest: No acute abnormality. Hepatobiliary: Liver shows scattered small hypodensities likely representing cysts but too small for adequate characterization. The gallbladder is within normal limits. Pancreas: Unremarkable. No pancreatic ductal dilatation or surrounding inflammatory changes. Spleen: Normal in size without focal abnormality. Adrenals/Urinary Tract: Right adrenal gland is within normal limits. Left adrenal gland demonstrates a 3 cm lesion with Hounsfield unit measurement of 84. Kidneys demonstrate a normal enhancement pattern. No renal calculi or obstructive changes are noted. The bladder is partially distended. Stomach/Bowel: No obstructive or inflammatory changes of the colon are noted. The appendix is not visualized consistent with prior surgical history. Small bowel and stomach are within normal limits. Vascular/Lymphatic: Aortic atherosclerosis. No enlarged abdominal or pelvic lymph nodes. Reproductive: Prostate is unremarkable. Other: No abdominal wall hernia or abnormality. No abdominopelvic ascites. Musculoskeletal: No acute or significant osseous findings. IMPRESSION: Left adrenal mass measuring 3 cm, probable benign adenoma. Recommend nonemergent adrenal washout CT or chemical shift MRI. JACR 2017 Aug; 14(8):1038-44, JCAT 2016 Mar-Apr; 40(2):194-200, Urol J 2006 Spring; 3(2):71-4. No other focal abnormality is noted. Electronically Signed   By: Alcide Clever M.D.   On: 09/08/2023 20:36    Procedures Procedures    Medications Ordered in ED Medications  sodium chloride 0.9 % bolus 1,000 mL (0 mLs Intravenous Stopped 09/08/23 2119)  iohexol (OMNIPAQUE) 300 MG/ML solution 100 mL (100 mLs Intravenous Contrast Given 09/08/23 1949)    ED Course/ Medical Decision Making/ A&P                                 Medical Decision Making Patient complains of abdominal pain nausea and vomiting  Amount and/or Complexity of Data Reviewed Independent Historian: spouse    Details:  Patient is here with family who is supportive External Data Reviewed: notes.    Details: Review his ED evaluation reviewed Labs: ordered. Decision-making details documented in ED Course.    Details: UA shows specific gravity is elevated with ketones. Radiology: ordered and independent interpretation performed. Decision-making details documented in ED Course.    Details: CT scan shows no acute findings.  Patient does have a 3 cm adrenal mass.  Risk Prescription drug management. Risk Details: Counseled the patient on adrenal mass I have advised he will need to follow-up with his primary care physician for further evaluation and possible further imaging.  Patient is given a prescription for Zofran for his nausea.  I have advised him to continue the omeprazole.  Patient is given information for follow-up with the gastroenterologist as he seems to have some ongoing gastritis issues along with his current nausea and vomiting.  Patient understands and agrees with plan           Final Clinical Impression(s) / ED Diagnoses Final  diagnoses:  Generalized abdominal pain    Rx / DC Orders ED Discharge Orders          Ordered    ondansetron (ZOFRAN-ODT) 4 MG disintegrating tablet  Every 8 hours PRN        09/08/23 2155           An After Visit Summary was printed and given to the patient.    Elson Areas, [place] 09/08/23 2353

## 2023-09-08 NOTE — ED Triage Notes (Signed)
Nausea/vomiting x few days Not really eating or keeping anything down Body aches  Last BM today

## 2023-09-09 ENCOUNTER — Other Ambulatory Visit: Payer: Self-pay

## 2023-09-09 ENCOUNTER — Ambulatory Visit (INDEPENDENT_AMBULATORY_CARE_PROVIDER_SITE_OTHER): Payer: Self-pay

## 2023-09-09 NOTE — Telephone Encounter (Signed)
  Chief Complaint: abdominal pain Symptoms: lower abdominal pain, cramping, comes and goes, poor appetite, nausea  Frequency: 4 days  Pertinent Negatives: Patient denies vomiting or diarrhea  Disposition: [] ED /[] Urgent Care (no appt availability in office) / [x] Appointment(In office/virtual)/ []  [place] [facility]/ [] Home Care/ [] Refused Recommended Disposition /[] [place] [facility]/ []  Follow-up with PCP Additional Notes: pt states he still having sx. Is taking the Ondansetron but not a lot of relief. Wanting to know if ok to try Pepto with sx. Pt has HFU on 09/20/23, added appt to waitlist, Care advice given and pt verbalized understanding.   Summary: stomach issues   Recently was in the Er for stomach issues, currently taking zofran but not really working. Would like to know if he can take imodium or pepto to coat stomach         Reason for Disposition  [1] MODERATE pain (e.g., interferes with normal activities) AND [2] pain comes and goes (cramps) AND [3] present > 24 hours  (Exception: Pain with Vomiting or Diarrhea - see that Guideline.)  Answer Assessment - Initial Assessment Questions 1. LOCATION: "Where does it hurt?"      Lower abdomen  3. ONSET: "When did the pain begin?" (Minutes, hours or days ago)      4 days  5. PATTERN "Does the pain come and go, or is it constant?"    - If it comes and goes: "How long does it last?" "Do you have pain now?"     (Note: Comes and goes means the pain is intermittent. It goes away completely between bouts.)    - If constant: "Is it getting better, staying the same, or getting worse?"      (Note: Constant means the pain never goes away completely; most serious pain is constant and gets worse.)      Comes and goes  6. SEVERITY: "How bad is the pain?"  (e.g., Scale 1-10; mild, moderate, or severe)    - MILD (1-3): Doesn't interfere with normal activities, abdomen soft and not tender to touch.     - MODERATE (4-7): Interferes with  normal activities or awakens from sleep, abdomen tender to touch.     - SEVERE (8-10): Excruciating pain, doubled over, unable to do any normal activities.       Cramping  10. OTHER SYMPTOMS: "Do you have any other symptoms?" (e.g., back pain, diarrhea, fever, urination pain, vomiting)       Poor appetite and nausea  Protocols used: Abdominal Pain - Male-A-AH

## 2023-09-10 ENCOUNTER — Emergency Department (HOSPITAL_COMMUNITY)
Admission: EM | Admit: 2023-09-10 | Discharge: 2023-09-11 | Discharge status: Against Medical Advice | Payer: Self-pay | Attending: Emergency Medicine | Admitting: Emergency Medicine

## 2023-09-10 ENCOUNTER — Encounter (HOSPITAL_COMMUNITY): Payer: Self-pay | Admitting: Emergency Medicine

## 2023-09-10 ENCOUNTER — Ambulatory Visit (INDEPENDENT_AMBULATORY_CARE_PROVIDER_SITE_OTHER): Payer: Self-pay

## 2023-09-10 ENCOUNTER — Other Ambulatory Visit: Payer: Self-pay

## 2023-09-10 DIAGNOSIS — Z5321 Procedure and treatment not carried out due to patient leaving prior to being seen by health care provider: Secondary | ICD-10-CM | POA: Insufficient documentation

## 2023-09-10 DIAGNOSIS — R1030 Lower abdominal pain, unspecified: Secondary | ICD-10-CM | POA: Insufficient documentation

## 2023-09-10 LAB — CBC
HCT: 45.7 % (ref 39.0–52.0)
Hemoglobin: 15.5 g/dL (ref 13.0–17.0)
MCH: 30 pg (ref 26.0–34.0)
MCHC: 33.9 g/dL (ref 30.0–36.0)
MCV: 88.4 fL (ref 80.0–100.0)
Platelets: 237 10*3/uL (ref 150–400)
RBC: 5.17 MIL/uL (ref 4.22–5.81)
RDW: 12.8 % (ref 11.5–15.5)
WBC: 6.5 10*3/uL (ref 4.0–10.5)
nRBC: 0 % (ref 0.0–0.2)

## 2023-09-10 LAB — URINALYSIS, ROUTINE W REFLEX MICROSCOPIC
Bacteria, UA: NONE SEEN
Bilirubin Urine: NEGATIVE
Glucose, UA: NEGATIVE mg/dL
Hgb urine dipstick: NEGATIVE
Ketones, ur: 5 mg/dL — AB
Leukocytes,Ua: NEGATIVE
Nitrite: NEGATIVE
Protein, ur: 30 mg/dL — AB
Specific Gravity, Urine: 1.027 (ref 1.005–1.030)
pH: 6 (ref 5.0–8.0)

## 2023-09-10 LAB — COMPREHENSIVE METABOLIC PANEL
ALT: 10 U/L (ref 0–44)
AST: 16 U/L (ref 15–41)
Albumin: 4 g/dL (ref 3.5–5.0)
Alkaline Phosphatase: 68 U/L (ref 38–126)
Anion gap: 9 (ref 5–15)
BUN: 8 mg/dL (ref 6–20)
CO2: 24 mmol/L (ref 22–32)
Calcium: 9.2 mg/dL (ref 8.9–10.3)
Chloride: 100 mmol/L (ref 98–111)
Creatinine, Ser: 1.08 mg/dL (ref 0.61–1.24)
GFR, Estimated: >60 mL/min (ref >60)
Glucose, Bld: 114 mg/dL — ABNORMAL HIGH (ref 70–99)
Potassium: 3.6 mmol/L (ref 3.5–5.1)
Sodium: 133 mmol/L — ABNORMAL LOW (ref 135–145)
Total Bilirubin: 0.6 mg/dL (ref 0.0–1.2)
Total Protein: 7.6 g/dL (ref 6.5–8.1)

## 2023-09-10 LAB — LIPASE, BLOOD: Lipase: 34 U/L (ref 11–51)

## 2023-09-10 NOTE — Telephone Encounter (Signed)
  Chief Complaint: abdominal pain (severe) Symptoms: cramping to lower abdomen and 4 finger width above navel Frequency: 4 days Disposition: [x] ED /[] Urgent Care (no appt availability in office) / [] Appointment(In office/virtual)/ []  [place] [facility]/ [] Home Care/ [] Refused Recommended Disposition /[] [place] [facility]/ []  Follow-up with PCP Additional Notes:  Reason for Disposition  [1] SEVERE pain (e.g., excruciating) AND [2] present > 1 hour  Answer Assessment - Initial Assessment Questions 1. LOCATION: Where does it hurt?      Below belly button and across bottom sometimes above 4 fingers above navel  3. ONSET: When did the pain begin? (Minutes, hours or days ago)      4 days   5. PATTERN Does the pain come and go, or is it constant?    - If it comes and goes: How long does it last? Do you have pain now?     (Note: Comes and goes means the pain is intermittent. It goes away completely between bouts.)    - If constant: Is it getting better, staying the same, or getting worse?      (Note: Constant means the pain never goes away completely; most serious pain is constant and gets worse.)      constant 6. SEVERITY: How bad is the pain?  (e.g., Scale 1-10; mild, moderate, or severe)    - MILD (1-3): Doesn't interfere with normal activities, abdomen soft and not tender to touch.     - MODERATE (4-7): Interferes with normal activities or awakens from sleep, abdomen tender to touch.     - SEVERE (8-10): Excruciating pain, doubled over, unable to do any normal activities.       Cramping-severe 7. RECURRENT SYMPTOM: Have you ever had this type of stomach pain before? If Yes, ask: When was the last time? and What happened that time?      Off and on for months 8. CAUSE: What do you think is causing the stomach pain?     unsure  Protocols used: Abdominal Pain - Male-A-AH

## 2023-09-10 NOTE — ED Triage Notes (Signed)
 Pt reports lower abd cramping x 4 days.  States he was seen at [facility] yesterday and had abd CT and GI referral.  Nausea is better but continues to have cramping.

## 2023-09-11 NOTE — ED Notes (Signed)
 Called pt for vitals multiple times. No response.

## 2023-09-11 NOTE — ED Notes (Signed)
Pt called for vital signs, no answer.  

## 2023-09-12 ENCOUNTER — Encounter (HOSPITAL_BASED_OUTPATIENT_CLINIC_OR_DEPARTMENT_OTHER): Payer: Self-pay | Admitting: Emergency Medicine

## 2023-09-12 ENCOUNTER — Other Ambulatory Visit: Payer: Self-pay

## 2023-09-12 ENCOUNTER — Emergency Department (HOSPITAL_BASED_OUTPATIENT_CLINIC_OR_DEPARTMENT_OTHER)
Admission: EM | Admit: 2023-09-12 | Discharge: 2023-09-12 | Disposition: A | Discharge status: Home / Self Care | Payer: 59 | Attending: Emergency Medicine | Admitting: Emergency Medicine

## 2023-09-12 DIAGNOSIS — R1013 Epigastric pain: Secondary | ICD-10-CM | POA: Diagnosis not present

## 2023-09-12 DIAGNOSIS — R1012 Left upper quadrant pain: Secondary | ICD-10-CM | POA: Diagnosis not present

## 2023-09-12 DIAGNOSIS — Z1152 Encounter for screening for COVID-19: Secondary | ICD-10-CM | POA: Diagnosis not present

## 2023-09-12 DIAGNOSIS — R1011 Right upper quadrant pain: Secondary | ICD-10-CM | POA: Insufficient documentation

## 2023-09-12 DIAGNOSIS — R739 Hyperglycemia, unspecified: Secondary | ICD-10-CM | POA: Diagnosis not present

## 2023-09-12 DIAGNOSIS — R1010 Upper abdominal pain, unspecified: Secondary | ICD-10-CM

## 2023-09-12 DIAGNOSIS — R112 Nausea with vomiting, unspecified: Secondary | ICD-10-CM | POA: Diagnosis not present

## 2023-09-12 LAB — COMPREHENSIVE METABOLIC PANEL
ALT: 7 U/L (ref 0–44)
AST: 13 U/L — ABNORMAL LOW (ref 15–41)
Albumin: 5 g/dL (ref 3.5–5.0)
Alkaline Phosphatase: 72 U/L (ref 38–126)
Anion gap: 12 (ref 5–15)
BUN: 14 mg/dL (ref 6–20)
CO2: 26 mmol/L (ref 22–32)
Calcium: 9.9 mg/dL (ref 8.9–10.3)
Chloride: 98 mmol/L (ref 98–111)
Creatinine, Ser: 1.06 mg/dL (ref 0.61–1.24)
GFR, Estimated: >60 mL/min (ref >60)
Glucose, Bld: 110 mg/dL — ABNORMAL HIGH (ref 70–99)
Potassium: 3.6 mmol/L (ref 3.5–5.1)
Sodium: 136 mmol/L (ref 135–145)
Total Bilirubin: 0.8 mg/dL (ref 0.0–1.2)
Total Protein: 8.8 g/dL — ABNORMAL HIGH (ref 6.5–8.1)

## 2023-09-12 LAB — RESP PANEL BY RT-PCR (RSV, FLU A&B, COVID)  RVPGX2
Influenza A by PCR: NEGATIVE
Influenza B by PCR: NEGATIVE
Resp Syncytial Virus by PCR: NEGATIVE
SARS Coronavirus 2 by RT PCR: NEGATIVE

## 2023-09-12 LAB — URINALYSIS, ROUTINE W REFLEX MICROSCOPIC
Bacteria, UA: NONE SEEN
Bilirubin Urine: NEGATIVE
Glucose, UA: NEGATIVE mg/dL
Hgb urine dipstick: NEGATIVE
Ketones, ur: 40 mg/dL — AB
Leukocytes,Ua: NEGATIVE
Nitrite: NEGATIVE
Protein, ur: 30 mg/dL — AB
Specific Gravity, Urine: 1.036 — ABNORMAL HIGH (ref 1.005–1.030)
pH: 6 (ref 5.0–8.0)

## 2023-09-12 LAB — LIPASE, BLOOD: Lipase: 15 U/L (ref 11–51)

## 2023-09-12 LAB — CBC
HCT: 48.1 % (ref 39.0–52.0)
Hemoglobin: 16.8 g/dL (ref 13.0–17.0)
MCH: 29.9 pg (ref 26.0–34.0)
MCHC: 34.9 g/dL (ref 30.0–36.0)
MCV: 85.7 fL (ref 80.0–100.0)
Platelets: 236 10*3/uL (ref 150–400)
RBC: 5.61 MIL/uL (ref 4.22–5.81)
RDW: 12.7 % (ref 11.5–15.5)
WBC: 7.5 10*3/uL (ref 4.0–10.5)
nRBC: 0 % (ref 0.0–0.2)

## 2023-09-12 LAB — TROPONIN I (HIGH SENSITIVITY): Troponin I (High Sensitivity): 7 ng/L (ref <18)

## 2023-09-12 MED ORDER — PANTOPRAZOLE SODIUM 40 MG IV SOLR
40.0000 mg | Freq: Once | INTRAVENOUS | Status: AC
  Administered 2023-09-12: 40 mg via INTRAVENOUS
  Filled 2023-09-12: qty 10

## 2023-09-12 MED ORDER — SODIUM CHLORIDE 0.9 % IV BOLUS
1000.0000 mL | Freq: Once | INTRAVENOUS | Status: AC
  Administered 2023-09-12: 1000 mL via INTRAVENOUS

## 2023-09-12 MED ORDER — OXYCODONE HCL 5 MG PO TABS: 5.0000 mg | ORAL_TABLET | Freq: Four times a day (QID) | ORAL | 0 refills | Status: DC | PRN

## 2023-09-12 MED ORDER — DICYCLOMINE HCL 10 MG/ML IM SOLN
20.0000 mg | Freq: Once | INTRAMUSCULAR | Status: AC
  Administered 2023-09-12: 20 mg via INTRAMUSCULAR
  Filled 2023-09-12: qty 2

## 2023-09-12 MED ORDER — SUCRALFATE 1 G PO TABS: 1.0000 g | ORAL_TABLET | Freq: Three times a day (TID) | ORAL | 1 refills | Status: AC

## 2023-09-12 MED ORDER — ONDANSETRON HCL 4 MG/2ML IJ SOLN
4.0000 mg | Freq: Once | INTRAMUSCULAR | Status: AC
  Administered 2023-09-12: 4 mg via INTRAVENOUS
  Filled 2023-09-12: qty 2

## 2023-09-12 MED ORDER — PANTOPRAZOLE SODIUM 40 MG PO TBEC
40.0000 mg | DELAYED_RELEASE_TABLET | Freq: Every day | ORAL | 0 refills | Status: DC
Start: 2023-09-12 — End: 2024-06-14

## 2023-09-12 MED ORDER — MORPHINE SULFATE (PF) 4 MG/ML IV SOLN
4.0000 mg | Freq: Once | INTRAVENOUS | Status: AC
Start: 2023-09-12 — End: 2023-09-12
  Administered 2023-09-12: 4 mg via INTRAVENOUS
  Filled 2023-09-12: qty 1

## 2023-09-12 MED ORDER — DICYCLOMINE HCL 20 MG PO TABS: 20.0000 mg | ORAL_TABLET | Freq: Two times a day (BID) | ORAL | 0 refills | Status: DC | PRN

## 2023-09-12 MED ORDER — FAMOTIDINE 20 MG PO TABS
20.0000 mg | ORAL_TABLET | Freq: Two times a day (BID) | ORAL | 0 refills | Status: DC
Start: 2023-09-12 — End: 2024-06-14

## 2023-09-12 NOTE — ED Notes (Signed)
 Pt given water and saltine crackers. Pt tolerating well.

## 2023-09-12 NOTE — ED Provider Notes (Signed)
 [place] EMERGENCY DEPARTMENT AT [facility] Provider Note   CSN: [id number] Arrival date & time: 09/12/23  1146     History  Chief Complaint  Patient presents with   Nausea   Abdominal Pain    Jeremy Randall is a 53 y.o. male.  Patient with history of appendectomy presents to the emergency department today for evaluation of bilateral upper abdominal pain.  He has also had vomiting, last episode 30 minutes ago.  Pain does not radiate.  It is not exertional.  Patient was seen in the emergency department on 09/08/23, he had CT imaging of the abdomen pelvis at that time which was negative.  Labs are reassuring.  Denies chest pain or shortness of breath.  Family member bedside states that he is up-and-down all night.  He has been taking omeprazole  as well as Zofran  without improvement.  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  Denies alcohol or drug use.  Denies heavy NSAID use.       Home Medications Prior to Admission medications   Medication Sig Start Date End Date Taking? Authorizing Provider  amoxicillin -clavulanate (AUGMENTIN ) 875-125 MG tablet Take 1 tablet by mouth every 12 (twelve) hours. 08/30/22   Raspet, Erin K, [name]  HYDROcodone -acetaminophen  (NORCO/VICODIN) 5-325 MG tablet Take 1 tablet by mouth 2 (two) times daily as needed for up to 2 doses. 08/30/22   Raspet, Rocky POUR, [name]  omeprazole  (PRILOSEC) 20 MG capsule Take 1 capsule (20 mg total) by mouth daily. 11/20/22   Palumbo, April, MD  ondansetron  (ZOFRAN -ODT) 4 MG disintegrating tablet Take 1 tablet (4 mg total) by mouth every 8 (eight) hours as needed for nausea or vomiting. 09/08/23   Sofia, Leslie K, [name]  pravastatin  (PRAVACHOL ) 20 MG tablet Take 1 tablet (20 mg total) by mouth daily. 12/05/22   Celestia Rosaline SQUIBB, NP      Allergies    Patient has no known allergies.    Review of Systems   Review of Systems  Physical Exam Updated Vital Signs BP (!) 177/97 (BP Location: Right Arm)   Pulse (!) 56   Temp 97.6  F (36.4 C)   Resp 18   Ht 6' 2 (1.88 m)   Wt 86.2 kg   SpO2 100%   BMI 24.39 kg/m   Physical Exam Vitals and nursing note reviewed.  Constitutional:      General: He is not in acute distress.    Appearance: He is well-developed.  HENT:     Head: Normocephalic and atraumatic.  Eyes:     General:        Right eye: No discharge.        Left eye: No discharge.     Conjunctiva/sclera: Conjunctivae normal.  Cardiovascular:     Rate and Rhythm: Normal rate and regular rhythm.     Heart sounds: Normal heart sounds.  Pulmonary:     Effort: Pulmonary effort is normal.     Breath sounds: Normal breath sounds.  Abdominal:     Palpations: Abdomen is soft.     Tenderness: There is abdominal tenderness in the right upper quadrant, epigastric area and left upper quadrant. There is no guarding or rebound. Negative signs include Murphy's sign and McBurney's sign.  Musculoskeletal:     Cervical back: Normal range of motion and neck supple.  Skin:    General: Skin is warm and dry.  Neurological:     Mental Status: He is alert.     ED Results / Procedures /  Treatments   Labs (all labs ordered are listed, but only abnormal results are displayed) Labs Reviewed  COMPREHENSIVE METABOLIC PANEL - Abnormal; Notable for the following components:      Result Value   Glucose, Bld 110 (*)    Total Protein 8.8 (*)    AST 13 (*)    All other components within normal limits  URINALYSIS, ROUTINE W REFLEX MICROSCOPIC - Abnormal; Notable for the following components:   Specific Gravity, Urine 1.036 (*)    Ketones, ur 40 (*)    Protein, ur 30 (*)    All other components within normal limits  RESP PANEL BY RT-PCR (RSV, FLU A&B, COVID)  RVPGX2  LIPASE, BLOOD  CBC  TROPONIN I (HIGH SENSITIVITY)    EKG None  Radiology No results found.  Procedures Procedures    Medications Ordered in ED Medications  sodium chloride  0.9 % bolus 1,000 mL (has no administration in time range)  morphine   (PF) 4 MG/ML injection 4 mg (has no administration in time range)  ondansetron  (ZOFRAN ) injection 4 mg (has no administration in time range)  pantoprazole  (PROTONIX ) injection 40 mg (has no administration in time range)    ED Course/ Medical Decision Making/ A&P    Patient seen and examined. History obtained directly from patient. Work-up including labs, imaging, EKG ordered in triage, if performed, were reviewed.    Labs/EKG: Independently reviewed and interpreted.  This included: CBC unremarkable with normal white blood cell count; CMP with normal transaminases and blood sugar minimally elevated at 110 otherwise unremarkable; lipase normal at 15; UA with 40 ketones and concentrated suggesting an element of dehydration.  Given cardiac risk factors, will check troponin and EKG to rule out atypical ACS.  Imaging: None ordered  Medications/Fluids: Ordered: IV fluid bolus, IV morphine , IV Zofran , IV Protonix .  Most recent vital signs reviewed and are as follows: BP (!) 177/97 (BP Location: Right Arm)   Pulse (!) 56   Temp 97.6 F (36.4 C)   Resp 18   Ht 6' 2 (1.88 m)   Wt 86.2 kg   SpO2 100%   BMI 24.39 kg/m   Initial impression: Upper abdominal pain and vomiting, workup thus far negative with recent CT imaging, consider possibility of peptic ulcer disease/gastritis.    Reassessment performed. Patient appears comfortable.  He did request IM Bentyl  earlier which she has received.  Pain is improved from earlier.  Labs personally reviewed and interpreted including: Troponin was normal, respiratory panel negative.  Reviewed pertinent lab work and imaging with patient at bedside. Questions answered.   Most current vital signs reviewed and are as follows: BP (!) 144/87   Pulse 60   Temp 98.8 F (37.1 C) (Oral)   Resp 13   Ht 6' 2 (1.88 m)   Wt 86.2 kg   SpO2 100%   BMI 24.39 kg/m   Plan: Discharge to home.  Patient's family member does state that they were able to get a  GI appointment in the next couple of weeks.  I would like him to trial PPI/carafate , H2 blocker for 1 week, Bentyl  as needed, oxycodone  as needed.  Prescriptions written for: Protonix , famotidine , Bentyl , Carafate , oxycodone .  Patient counseled on use of narcotic pain medications. Counseled not to combine these medications with others containing tylenol . Urged not to drink alcohol, drive, or perform any other activities that requires focus while taking these medications. The patient verbalizes understanding and agrees with the plan.  Other home care instructions discussed: Maintain  good hydration, bland diet  ED return instructions discussed: The patient was urged to return to the Emergency Department immediately with worsening of current symptoms, worsening abdominal pain, persistent vomiting, blood noted in stools, fever, or any other concerns. The patient verbalized understanding.   Follow-up instructions discussed: Patient encouraged to follow-up with their PCP in 7 days.                                  Medical Decision Making Amount and/or Complexity of Data Reviewed Labs: ordered.  Risk Prescription drug management.   For this patient's complaint of abdominal pain, the following conditions were considered on the differential diagnosis: gastritis/PUD, enteritis/duodenitis, appendicitis, cholelithiasis/cholecystitis, cholangitis, pancreatitis, ruptured viscus, colitis, diverticulitis, small/large bowel obstruction, proctitis, cystitis, pyelonephritis, ureteral colic, aortic dissection, aortic aneurysm. Atypical chest etiologies were also considered including ACS, PE, and pneumonia.  The patient's vital signs, pertinent lab work and imaging were reviewed and interpreted as discussed in the ED course. Hospitalization was considered for further testing, treatments, or serial exams/observation. However as patient is well-appearing, has a stable exam, and reassuring studies today, I do not  feel that they warrant admission at this time. This plan was discussed with the patient who verbalizes agreement and comfort with this plan and seems reliable and able to return to the Emergency Department with worsening or changing symptoms.           Final Clinical Impression(s) / ED Diagnoses Final diagnoses:  Upper abdominal pain  Nausea and vomiting, unspecified vomiting type    Rx / DC Orders ED Discharge Orders          Ordered    pantoprazole  (PROTONIX ) 40 MG tablet  Daily        09/12/23 1741    famotidine  (PEPCID ) 20 MG tablet  2 times daily        09/12/23 1741    sucralfate  (CARAFATE ) 1 g tablet  3 times daily with meals & bedtime        09/12/23 1741    dicyclomine  (BENTYL ) 20 MG tablet  2 times daily PRN        09/12/23 1741    oxyCODONE  (OXY IR/ROXICODONE ) 5 MG immediate release tablet  Every 6 hours PRN        09/12/23 1741              Desiderio Chew, [name] 09/12/23 DANIAL Jerral Meth, MD 09/13/23 [phone]

## 2023-09-12 NOTE — ED Triage Notes (Signed)
 C/o N/V and lower abd cramping x 1 week. Multiple recent ED visits for same. Also endorses a "dental infection" that he is worried about.

## 2023-09-12 NOTE — Discharge Instructions (Addendum)
 Please read and follow all provided instructions.  Your diagnoses today include:  1. Upper abdominal pain   2. Nausea and vomiting, unspecified vomiting type     Tests performed today include: Blood cell counts and platelets: Was normal Kidney and liver function tests: No problems Pancreas function test (called lipase): Was normal Cardiac enzyme: No signs of stress on the heart or heart attack Flu, COVID, RSV testing: Was negative Vital signs. See below for your results today.   Medications prescribed:  Pantoprazole  - stomach acid reducer (Take this medication everyday)  Pepcid  (famotidine ) -stomach acid reducer (Take this medication twice a day for one week)  Carafate  - for stomach upset and to protect your stomach (Take with meals and at bedtime)  Bentyl  - medication for intestinal cramps and spasms (Take this medication as needed for abdominal spasms)  Oxycodone  - narcotic pain medication (Take only for severe pain)  DO NOT drive or perform any activities that require you to be awake and alert because this medicine can make you drowsy.   Home care instructions:  Follow any educational materials contained in this packet.  Follow-up instructions: Please follow-up with your primary care provider in the next 7 days for further evaluation of your symptoms.    Please keep your gastroenterology appointment as planned.  Return instructions:  SEEK IMMEDIATE MEDICAL ATTENTION IF: The pain does not go away or becomes severe  A temperature above 101F develops  Repeated vomiting occurs (multiple episodes)  The pain becomes localized to portions of the abdomen. The right side could possibly be appendicitis. In an adult, the left lower portion of the abdomen could be colitis or diverticulitis.  Blood is being passed in stools or vomit (bright red or black tarry stools)  You develop chest pain, difficulty breathing, dizziness or fainting, or become confused, poorly responsive, or  inconsolable (young children) If you have any other emergent concerns regarding your health  Additional Information: Abdominal (belly) pain can be caused by many things. Your caregiver performed an examination and possibly ordered blood/urine tests and imaging (CT scan, x-rays, ultrasound). Many cases can be observed and treated at home after initial evaluation in the emergency department. Even though you are being discharged home, abdominal pain can be unpredictable. Therefore, you need a repeated exam if your pain does not resolve, returns, or worsens. Most patients with abdominal pain don't have to be admitted to the hospital or have surgery, but serious problems like appendicitis and gallbladder attacks can start out as nonspecific pain. Many abdominal conditions cannot be diagnosed in one visit, so follow-up evaluations are very important.  Your vital signs today were: BP (!) 144/87   Pulse 60   Temp 98.8 F (37.1 C) (Oral)   Resp 13   Ht 6' 2 (1.88 m)   Wt 86.2 kg   SpO2 100%   BMI 24.39 kg/m  If your blood pressure (bp) was elevated above 135/85 this visit, please have this repeated by your doctor within one month. --------------

## 2023-09-13 NOTE — Telephone Encounter (Signed)
 Pt went to the ED 09/12/23

## 2023-09-16 NOTE — Telephone Encounter (Signed)
 Noted patient has an appointment with me on 09/20/2023

## 2023-09-19 ENCOUNTER — Telehealth (INDEPENDENT_AMBULATORY_CARE_PROVIDER_SITE_OTHER): Payer: Self-pay | Admitting: Primary Care

## 2023-09-19 NOTE — Telephone Encounter (Signed)
 Called pt to make them aware of apt. Pt will be present

## 2023-09-20 ENCOUNTER — Other Ambulatory Visit: Payer: Self-pay

## 2023-09-20 ENCOUNTER — Encounter (INDEPENDENT_AMBULATORY_CARE_PROVIDER_SITE_OTHER): Payer: Self-pay | Admitting: Primary Care

## 2023-09-20 ENCOUNTER — Ambulatory Visit (INDEPENDENT_AMBULATORY_CARE_PROVIDER_SITE_OTHER): Payer: Self-pay | Admitting: Primary Care

## 2023-09-20 VITALS — BP 125/81 | HR 79 | Resp 16 | Ht 74.0 in | Wt 208.8 lb

## 2023-09-20 DIAGNOSIS — Z28310 Unvaccinated for covid-19: Secondary | ICD-10-CM | POA: Diagnosis not present

## 2023-09-20 DIAGNOSIS — E782 Mixed hyperlipidemia: Secondary | ICD-10-CM | POA: Diagnosis not present

## 2023-09-20 DIAGNOSIS — K0889 Other specified disorders of teeth and supporting structures: Secondary | ICD-10-CM | POA: Diagnosis not present

## 2023-09-20 DIAGNOSIS — R7303 Prediabetes: Secondary | ICD-10-CM | POA: Diagnosis not present

## 2023-09-20 DIAGNOSIS — Z09 Encounter for follow-up examination after completed treatment for conditions other than malignant neoplasm: Secondary | ICD-10-CM

## 2023-09-20 DIAGNOSIS — Z2821 Immunization not carried out because of patient refusal: Secondary | ICD-10-CM

## 2023-09-20 MED ORDER — AMOXICILLIN-POT CLAVULANATE 875-125 MG PO TABS
1.0000 | ORAL_TABLET | Freq: Two times a day (BID) | ORAL | 0 refills | Status: DC
  Filled 2023-09-20: qty 20, 10d supply, fill #0

## 2023-09-20 NOTE — Progress Notes (Addendum)
 Subjective:   Jeremy Randall is a 53 y.o. male presents for ED d/c 09/12/23, patient was discharged dx Upper abdominal pain , Nausea and vomiting, unspecified vomiting type tx with trial PPI/carafate , H2 blocker for 1 week, Bentyl  as needed, oxycodone  as needed. Appt schedule with GI/ f/u- 09/25/23 Today he c/o dental pain sensitivity to hot/cold,malodorous taste.Tx once with abts-  Past Medical History:  Diagnosis Date   Asthma      No Known Allergies  Current Outpatient Medications on File Prior to Visit  Medication Sig Dispense Refill   dicyclomine  (BENTYL ) 20 MG tablet Take 1 tablet (20 mg total) by mouth 2 (two) times daily as needed (abdominal spasms). 20 tablet 0   famotidine  (PEPCID ) 20 MG tablet Take 1 tablet (20 mg total) by mouth 2 (two) times daily. 14 tablet 0   ondansetron  (ZOFRAN -ODT) 4 MG disintegrating tablet Take 1 tablet (4 mg total) by mouth every 8 (eight) hours as needed for nausea or vomiting. 20 tablet 0   oxyCODONE  (OXY IR/ROXICODONE ) 5 MG immediate release tablet Take 1 tablet (5 mg total) by mouth every 6 (six) hours as needed for severe pain (pain score 7-10). 5 tablet 0   pantoprazole  (PROTONIX ) 40 MG tablet Take 1 tablet (40 mg total) by mouth daily. 30 tablet 0   pravastatin  (PRAVACHOL ) 20 MG tablet Take 1 tablet (20 mg total) by mouth daily. 90 tablet 3   sucralfate  (CARAFATE ) 1 g tablet Take 1 tablet (1 g total) by mouth 4 (four) times daily -  with meals and at bedtime. 60 tablet 1   No current facility-administered medications on file prior to visit.    Review of System:  Comprehensive ROS Pertinent positive and negative noted in HPI   Objective:  BP 125/81   Pulse 79   Resp 16   Ht 6' 2 (1.88 m)   Wt 208 lb 12.8 oz (94.7 kg)   SpO2 98%   BMI 26.81 kg/m   Filed Weights   09/20/23 1008  Weight: 208 lb 12.8 oz (94.7 kg)    Physical Exam Vitals reviewed.  HENT:     Head: Normocephalic.     Right Ear: External ear normal.     Left Ear:  External ear normal.     Nose: Nose normal.  Eyes:     Extraocular Movements: Extraocular movements intact.  Cardiovascular:     Rate and Rhythm: Normal rate and regular rhythm.  Pulmonary:     Effort: Pulmonary effort is normal.     Breath sounds: Wheezing present.     Comments: Left upper and lower (smoker) Abdominal:     General: Bowel sounds are normal.     Palpations: Abdomen is soft.  Musculoskeletal:        General: Normal range of motion.     Cervical back: Normal range of motion.  Skin:    General: Skin is warm and dry.  Neurological:     Mental Status: He is alert and oriented to person, place, and time.  Psychiatric:        Mood and Affect: Mood normal.        Behavior: Behavior normal.      Assessment:  [name] was seen today for hospitalization follow-up and dental pain.  Diagnoses and all orders for this visit:  Herpes zoster vaccination declined  COVID-19 vaccine series declined  Influenza vaccination declined  Pneumococcal vaccination declined  Prediabetes -     Hemoglobin A1c  Mixed hyperlipidemia -  Lipid panel  Pain, dental -     amoxicillin -clavulanate (AUGMENTIN ) 875-125 MG tablet; Take 1 tablet by mouth 2 (two) times daily. Information provided for dental resources   Hospital discharge follow-up See HPI     This note has been created with Dragon speech recognition software and paediatric nurse. Any transcriptional errors are unintentional.   Return in about 6 months (around 03/19/2024).  Rosaline SHAUNNA Bohr, NP 09/20/2023, 10:53 AM

## 2023-09-22 LAB — SPECIMEN STATUS REPORT

## 2023-09-25 ENCOUNTER — Other Ambulatory Visit: Payer: Self-pay

## 2023-09-25 MED ORDER — FAMOTIDINE 20 MG PO TABS
20.0000 mg | ORAL_TABLET | Freq: Two times a day (BID) | ORAL | 5 refills | Status: DC
  Filled 2023-09-25: qty 60, 30d supply, fill #0

## 2023-09-26 ENCOUNTER — Encounter (INDEPENDENT_AMBULATORY_CARE_PROVIDER_SITE_OTHER): Payer: Self-pay

## 2023-09-26 NOTE — Telephone Encounter (Signed)
This encounter was created in error - please disregard.

## 2023-09-27 LAB — HEMOGLOBIN A1C
Est. average glucose Bld gHb Est-mCnc: 126 mg/dL
Hgb A1c MFr Bld: 6 % — ABNORMAL HIGH (ref 4.8–5.6)

## 2023-09-27 LAB — LIPID PANEL
Chol/HDL Ratio: 4.1 {ratio} (ref 0.0–5.0)
Cholesterol, Total: 179 mg/dL (ref 100–199)
HDL: 44 mg/dL (ref >39)
LDL Chol Calc (NIH): 121 mg/dL — ABNORMAL HIGH (ref 0–99)
Triglycerides: 76 mg/dL (ref 0–149)
VLDL Cholesterol Cal: 14 mg/dL (ref 5–40)

## 2023-09-27 LAB — SPECIMEN STATUS REPORT

## 2023-10-03 ENCOUNTER — Other Ambulatory Visit: Payer: Self-pay

## 2023-10-03 MED ORDER — PANTOPRAZOLE SODIUM 40 MG PO TBEC
40.0000 mg | DELAYED_RELEASE_TABLET | Freq: Every morning | ORAL | 0 refills | Status: DC
  Filled 2023-10-03 – 2023-10-07 (×2): qty 90, 90d supply, fill #0

## 2023-10-03 MED ORDER — DICYCLOMINE HCL 20 MG PO TABS
20.0000 mg | ORAL_TABLET | Freq: Three times a day (TID) | ORAL | 0 refills | Status: AC
  Filled 2023-10-03: qty 270, 90d supply, fill #0

## 2023-10-03 MED ORDER — SUCRALFATE 1 G PO TABS
1.0000 g | ORAL_TABLET | Freq: Three times a day (TID) | ORAL | 0 refills | Status: DC
  Filled 2023-10-03: qty 270, 90d supply, fill #0

## 2023-10-04 ENCOUNTER — Encounter (INDEPENDENT_AMBULATORY_CARE_PROVIDER_SITE_OTHER): Payer: Self-pay

## 2023-10-07 ENCOUNTER — Other Ambulatory Visit: Payer: Self-pay

## 2023-10-08 ENCOUNTER — Other Ambulatory Visit: Payer: Self-pay

## 2024-03-18 ENCOUNTER — Other Ambulatory Visit (HOSPITAL_COMMUNITY): Payer: Self-pay

## 2024-03-18 ENCOUNTER — Other Ambulatory Visit: Payer: Self-pay

## 2024-03-18 MED ORDER — PANTOPRAZOLE SODIUM 40 MG PO TBEC
40.0000 mg | DELAYED_RELEASE_TABLET | Freq: Every day | ORAL | 0 refills | Status: DC
  Filled 2024-03-19: qty 30, 30d supply, fill #0

## 2024-03-18 MED ORDER — ONDANSETRON 4 MG PO TBDP
4.0000 mg | ORAL_TABLET | Freq: Three times a day (TID) | ORAL | 0 refills | Status: DC | PRN
  Filled 2024-03-19: qty 20, 7d supply, fill #0

## 2024-03-18 MED FILL — Ondansetron Orally Disintegrating Tab 4 MG: ORAL | 7 days supply | Qty: 20 | Fill #0 | Status: CN

## 2024-03-18 MED FILL — Pantoprazole Sodium EC Tab 40 MG (Base Equiv): ORAL | 30 days supply | Qty: 30 | Fill #0 | Status: CN

## 2024-03-19 ENCOUNTER — Other Ambulatory Visit (HOSPITAL_COMMUNITY): Payer: Self-pay

## 2024-03-19 ENCOUNTER — Other Ambulatory Visit: Payer: Self-pay

## 2024-03-30 ENCOUNTER — Other Ambulatory Visit: Payer: Self-pay

## 2024-05-28 ENCOUNTER — Ambulatory Visit: Payer: Self-pay | Admitting: *Deleted

## 2024-05-28 NOTE — Telephone Encounter (Signed)
 Copied from CRM [id number]. Topic: Clinical - Red Word Triage >> May 28, 2024  9:17 AM Rosaria BRAVO wrote: Red Word that prompted transfer to Nurse Triage: Abdominal pain.    ----------------------------------------------------------------------- From previous Reason for Contact - Scheduling: Patient/patient representative is calling to schedule an appointment. Refer to attachments for appointment information. Reason for Disposition  Abdominal pain is a chronic symptom (recurrent or ongoing AND present > 4 weeks)  Answer Assessment - Initial Assessment Questions 1. LOCATION: Where does it hurt?      I'm having pain in upper part of my abd. In the middle of my stomach.   I've been to the hospital and had it checked out.    2. RADIATION: Does the pain shoot anywhere else? (e.g., chest, back)     No   3. ONSET: When did the pain begin? (Minutes, hours or days ago)      Started heavy for the last 2 weeks but when I went to the ED I was throwing up around that time.    It seems I stay bloated.   I don't pooh as much as I once did.    The day before yesterday I had a BM.   Yesterday no stomach pain until last night the pain started again.   Even now I'm hurting.   It's intermittent. 4. SUDDEN: Gradual or sudden onset?     Not asked 5. PATTERN Does the pain come and go, or is it constant?     It's intermittent I went to [facility] and they suggested a colonoscopy.   At worst 6. SEVERITY: How bad is the pain?  (e.g., Scale 1-10; mild, moderate, or severe)     7-8/10 at it's worst.    Now it's a 4-5/10 7. RECURRENT SYMPTOM: Have you ever had this type of stomach pain before? If Yes, ask: When was the last time? and What happened that time?      Yes   Seen in the ED 8. CAUSE: What do you think is causing the stomach pain? (e.g., gallstones, recent abdominal surgery)     I don't know 9. RELIEVING/AGGRAVATING FACTORS: What makes it better or worse? (e.g., antacids, bending  or twisting motion, bowel movement)     They gave me medicine from the ED and one for nausea because I was vomiting when I went to the ED.   Carafate  was given but it didn't help.  10. OTHER SYMPTOMS: Do you have any other symptoms? (e.g., back pain, diarrhea, fever, urination pain, vomiting)       Sometimes when I take a deep breath it hurts.  Protocols used: Abdominal Pain - Male-[facility] is closed until Sept. 30, 2025.       FYI Only or Action Required?: FYI only for provider.  Patient was last seen in primary care on 09/20/2023 by Celestia Rosaline SQUIBB, NP.  Called Nurse Triage reporting Abdominal Pain.  Symptoms began several weeks ago.2 weeks ago has gotten worse.   Pain in upper stomach and bloating.  Constipation intermittently.    Interventions attempted: Prescription medications: Dicyclomine  given to him in ED on 09/12/2023.for abd pain  Symptoms are: gradually worsening over the last 2 weeks. .  Triage Disposition: See PCP Within 2 Weeks The practice is closed until Sept. 30, 2025 so I scheduled him for Oct. 7, 2025 at 4:10 with Rosaline Celestia NP with instructions to return to the ED if his abd pain became worse or unable to have  a BM in 3-5 days.   Pt agreeable to this plan.    Patient/caregiver understands and will follow disposition?: Yes

## 2024-05-28 NOTE — Telephone Encounter (Signed)
 Noted

## 2024-06-13 ENCOUNTER — Emergency Department (HOSPITAL_BASED_OUTPATIENT_CLINIC_OR_DEPARTMENT_OTHER): Payer: Self-pay

## 2024-06-13 ENCOUNTER — Inpatient Hospital Stay (HOSPITAL_BASED_OUTPATIENT_CLINIC_OR_DEPARTMENT_OTHER)
Admission: EM | Admit: 2024-06-13 | Discharge: 2024-06-16 | DRG: 381 | Disposition: A | Discharge status: Home / Self Care | Payer: Self-pay | Attending: Internal Medicine | Admitting: Internal Medicine

## 2024-06-13 ENCOUNTER — Other Ambulatory Visit: Payer: Self-pay

## 2024-06-13 ENCOUNTER — Encounter (HOSPITAL_BASED_OUTPATIENT_CLINIC_OR_DEPARTMENT_OTHER): Payer: Self-pay | Admitting: Emergency Medicine

## 2024-06-13 DIAGNOSIS — F17210 Nicotine dependence, cigarettes, uncomplicated: Secondary | ICD-10-CM | POA: Diagnosis present

## 2024-06-13 DIAGNOSIS — Z6825 Body mass index (BMI) 25.0-25.9, adult: Secondary | ICD-10-CM

## 2024-06-13 DIAGNOSIS — Z833 Family history of diabetes mellitus: Secondary | ICD-10-CM

## 2024-06-13 DIAGNOSIS — E441 Mild protein-calorie malnutrition: Secondary | ICD-10-CM | POA: Diagnosis present

## 2024-06-13 DIAGNOSIS — K251 Acute gastric ulcer with perforation: Principal | ICD-10-CM | POA: Diagnosis present

## 2024-06-13 DIAGNOSIS — Z8249 Family history of ischemic heart disease and other diseases of the circulatory system: Secondary | ICD-10-CM

## 2024-06-13 DIAGNOSIS — Z79899 Other long term (current) drug therapy: Secondary | ICD-10-CM

## 2024-06-13 DIAGNOSIS — I4891 Unspecified atrial fibrillation: Secondary | ICD-10-CM | POA: Diagnosis present

## 2024-06-13 DIAGNOSIS — K275 Chronic or unspecified peptic ulcer, site unspecified, with perforation: Principal | ICD-10-CM | POA: Diagnosis present

## 2024-06-13 DIAGNOSIS — R030 Elevated blood-pressure reading, without diagnosis of hypertension: Secondary | ICD-10-CM | POA: Diagnosis present

## 2024-06-13 DIAGNOSIS — J45909 Unspecified asthma, uncomplicated: Secondary | ICD-10-CM | POA: Diagnosis present

## 2024-06-13 DIAGNOSIS — K2970 Gastritis, unspecified, without bleeding: Secondary | ICD-10-CM | POA: Diagnosis present

## 2024-06-13 DIAGNOSIS — D3502 Benign neoplasm of left adrenal gland: Secondary | ICD-10-CM | POA: Diagnosis present

## 2024-06-13 LAB — COMPREHENSIVE METABOLIC PANEL WITH GFR
ALT: 8 U/L (ref 0–44)
AST: 14 U/L — ABNORMAL LOW (ref 15–41)
Albumin: 4.3 g/dL (ref 3.5–5.0)
Alkaline Phosphatase: 80 U/L (ref 38–126)
Anion gap: 11 (ref 5–15)
BUN: 7 mg/dL (ref 6–20)
CO2: 26 mmol/L (ref 22–32)
Calcium: 9.9 mg/dL (ref 8.9–10.3)
Chloride: 99 mmol/L (ref 98–111)
Creatinine, Ser: 0.95 mg/dL (ref 0.61–1.24)
GFR, Estimated: >60 mL/min (ref >60)
Glucose, Bld: 99 mg/dL (ref 70–99)
Potassium: 3.8 mmol/L (ref 3.5–5.1)
Sodium: 136 mmol/L (ref 135–145)
Total Bilirubin: 0.6 mg/dL (ref 0.0–1.2)
Total Protein: 7.8 g/dL (ref 6.5–8.1)

## 2024-06-13 LAB — CBC WITH DIFFERENTIAL/PLATELET
Abs Immature Granulocytes: 0.03 K/uL (ref 0.00–0.07)
Basophils Absolute: 0 K/uL (ref 0.0–0.1)
Basophils Relative: 1 %
Eosinophils Absolute: 0.2 K/uL (ref 0.0–0.5)
Eosinophils Relative: 2 %
HCT: 43.5 % (ref 39.0–52.0)
Hemoglobin: 15 g/dL (ref 13.0–17.0)
Immature Granulocytes: 1 %
Lymphocytes Relative: 28 %
Lymphs Abs: 1.9 K/uL (ref 0.7–4.0)
MCH: 29.8 pg (ref 26.0–34.0)
MCHC: 34.5 g/dL (ref 30.0–36.0)
MCV: 86.3 fL (ref 80.0–100.0)
Monocytes Absolute: 0.8 K/uL (ref 0.1–1.0)
Monocytes Relative: 13 %
Neutro Abs: 3.7 K/uL (ref 1.7–7.7)
Neutrophils Relative %: 55 %
Platelets: 243 K/uL (ref 150–400)
RBC: 5.04 MIL/uL (ref 4.22–5.81)
RDW: 13.5 % (ref 11.5–15.5)
WBC: 6.6 K/uL (ref 4.0–10.5)
nRBC: 0 % (ref 0.0–0.2)

## 2024-06-13 LAB — URINALYSIS, ROUTINE W REFLEX MICROSCOPIC
Bilirubin Urine: NEGATIVE
Glucose, UA: NEGATIVE mg/dL
Hgb urine dipstick: NEGATIVE
Ketones, ur: 15 mg/dL — AB
Leukocytes,Ua: NEGATIVE
Nitrite: NEGATIVE
Protein, ur: NEGATIVE mg/dL
Specific Gravity, Urine: 1.015 (ref 1.005–1.030)
pH: 7 (ref 5.0–8.0)

## 2024-06-13 LAB — LIPASE, BLOOD: Lipase: 30 U/L (ref 11–51)

## 2024-06-13 LAB — TROPONIN T, HIGH SENSITIVITY
Troponin T High Sensitivity: <15 ng/L (ref 0–19)
Troponin T High Sensitivity: <15 ng/L (ref 0–19)

## 2024-06-13 MED ORDER — PANTOPRAZOLE SODIUM 40 MG IV SOLR
40.0000 mg | Freq: Two times a day (BID) | INTRAVENOUS | Status: DC
  Administered 2024-06-13: 40 mg via INTRAVENOUS
  Filled 2024-06-13: qty 10

## 2024-06-13 MED ORDER — PANTOPRAZOLE SODIUM 40 MG IV SOLR
40.0000 mg | Freq: Two times a day (BID) | INTRAVENOUS | Status: DC
  Administered 2024-06-13 – 2024-06-16 (×6): 40 mg via INTRAVENOUS
  Filled 2024-06-13 (×6): qty 10

## 2024-06-13 MED ORDER — PANTOPRAZOLE SODIUM 40 MG IV SOLR: 40.0000 mg | Freq: Once | INTRAVENOUS | Status: DC

## 2024-06-13 MED ORDER — MORPHINE SULFATE (PF) 2 MG/ML IV SOLN
2.0000 mg | INTRAVENOUS | Status: DC | PRN
  Administered 2024-06-13 – 2024-06-16 (×11): 2 mg via INTRAVENOUS
  Filled 2024-06-13 (×12): qty 1

## 2024-06-13 MED ORDER — ORAL CARE MOUTH RINSE: 15.0000 mL | OROMUCOSAL | Status: DC | PRN

## 2024-06-13 MED ORDER — DEXTROSE IN LACTATED RINGERS 5 % IV SOLN: INTRAVENOUS | Status: AC

## 2024-06-13 MED ORDER — METOPROLOL TARTRATE 5 MG/5ML IV SOLN: 5.0000 mg | Freq: Four times a day (QID) | INTRAVENOUS | Status: DC | PRN

## 2024-06-13 MED ORDER — PROMETHAZINE HCL 25 MG PO TABS: 12.5000 mg | ORAL_TABLET | Freq: Four times a day (QID) | ORAL | Status: DC | PRN

## 2024-06-13 MED ORDER — PIPERACILLIN-TAZOBACTAM 3.375 G IVPB
3.3750 g | Freq: Three times a day (TID) | INTRAVENOUS | Status: DC
  Administered 2024-06-13 – 2024-06-16 (×8): 3.375 g via INTRAVENOUS
  Filled 2024-06-13 (×8): qty 50

## 2024-06-13 MED ORDER — MORPHINE SULFATE (PF) 4 MG/ML IV SOLN
4.0000 mg | Freq: Once | INTRAVENOUS | Status: AC
  Administered 2024-06-13: 4 mg via INTRAVENOUS
  Filled 2024-06-13: qty 1

## 2024-06-13 MED ORDER — HYDROMORPHONE HCL 1 MG/ML IJ SOLN
1.0000 mg | Freq: Once | INTRAMUSCULAR | Status: AC
  Administered 2024-06-13: 1 mg via INTRAVENOUS
  Filled 2024-06-13: qty 1

## 2024-06-13 MED ORDER — IOHEXOL 300 MG/ML  SOLN
100.0000 mL | Freq: Once | INTRAMUSCULAR | Status: AC | PRN
  Administered 2024-06-13: 100 mL via INTRAVENOUS

## 2024-06-13 MED ORDER — PIPERACILLIN-TAZOBACTAM 3.375 G IVPB 30 MIN
3.3750 g | Freq: Once | INTRAVENOUS | Status: AC
  Administered 2024-06-13: 3.375 g via INTRAVENOUS
  Filled 2024-06-13: qty 50

## 2024-06-13 MED ORDER — SODIUM CHLORIDE 0.9 % IV BOLUS
1000.0000 mL | Freq: Once | INTRAVENOUS | Status: AC
  Administered 2024-06-13: 1000 mL via INTRAVENOUS

## 2024-06-13 NOTE — ED Provider Notes (Signed)
 [place] EMERGENCY DEPARTMENT AT [facility] Provider Note   CSN: [id number] Arrival date & time: 06/13/24  1038     Patient presents with: Abdominal Pain   Jeremy Randall is a 53 y.o. male with a past medical history significant for asthma who presents to the ED due to upper abdominal pain associated with nausea and vomiting x 2 weeks.  No association with food.  Pain waxes and wanes. Admits to a few episodes of NBNB emesis.  Has had similar pain in the past.  Gallbladder intact.  Previous appendectomy.  Admits to feeling warm however, no documented fever.  Denies chest pain and shortness of breath. Denies alcohol or chronic NSAID use.   History obtained from patient and past medical records. No interpreter used during encounter.       Prior to Admission medications   Medication Sig Start Date End Date Taking? Authorizing Provider  amoxicillin -clavulanate (AUGMENTIN ) 875-125 MG tablet Take 1 tablet by mouth 2 (two) times daily. 09/20/23   Celestia Rosaline SQUIBB, NP  dicyclomine  (BENTYL ) 20 MG tablet Take 1 tablet (20 mg total) by mouth 2 (two) times daily as needed (abdominal spasms). 09/12/23   Geiple, Joshua, [name]  dicyclomine  (BENTYL ) 20 MG tablet Take 1 tablet (20 mg total) by mouth 3 (three) times daily as needed for abdominal pain. 10/03/23     famotidine  (PEPCID ) 20 MG tablet Take 1 tablet (20 mg total) by mouth 2 (two) times daily. 09/12/23   Geiple, Joshua, [name]  famotidine  (PEPCID ) 20 MG tablet Take 1 tablet (20 mg total) by mouth 2 (two) times daily. 09/25/23     ondansetron  (ZOFRAN -ODT) 4 MG disintegrating tablet Take 1 tablet (4 mg total) by mouth every 8 (eight) hours as needed for nausea or vomiting. 09/08/23   Sofia, Leslie K, [name]  ondansetron  (ZOFRAN -ODT) 4 MG disintegrating tablet Dissolve 1 tablet (4 mg total) by mouth every 8 (eight) hours as needed for nausea and vomiting. 03/18/24   Kathrine Rosaline, NP  oxyCODONE  (OXY IR/ROXICODONE ) 5 MG immediate release tablet Take  1 tablet (5 mg total) by mouth every 6 (six) hours as needed for severe pain (pain score 7-10). 09/12/23   Geiple, Joshua, [name]  pantoprazole  (PROTONIX ) 40 MG tablet Take 1 tablet (40 mg total) by mouth daily. 09/12/23   Geiple, Joshua, [name]  pantoprazole  (PROTONIX ) 40 MG tablet Take 1 tablet (40 mg total) by mouth in the morning 1/2 to 1 hour before morning meal. 10/03/23     pantoprazole  (PROTONIX ) 40 MG tablet Take 1 tablet (40 mg total) by mouth every morning before breakfast. 03/18/24   Kathrine Rosaline, NP  pravastatin  (PRAVACHOL ) 20 MG tablet Take 1 tablet (20 mg total) by mouth daily. 12/05/22   Celestia Rosaline SQUIBB, NP  sucralfate  (CARAFATE ) 1 g tablet Take 1 tablet (1 g total) by mouth 4 (four) times daily -  with meals and at bedtime. 09/12/23   Geiple, Joshua, [name]  sucralfate  (CARAFATE ) 1 g tablet Take 1 tablet (1 g total) by mouth 3 (three) times daily on an empty stomach. 10/03/23       Allergies: Patient has no known allergies.    Review of Systems  Constitutional:  Positive for fever.  Respiratory:  Negative for shortness of breath.   Cardiovascular:  Negative for chest pain.  Gastrointestinal:  Positive for abdominal pain, nausea and vomiting.    Updated Vital Signs BP (!) 182/98   Pulse (!) 49   Temp 98.3 F (36.8 C) (Oral)  Resp 14   Wt 89.4 kg   SpO2 98%   BMI 25.29 kg/m   Physical Exam Vitals and nursing note reviewed.  Constitutional:      General: He is not in acute distress.    Appearance: He is not ill-appearing.  HENT:     Head: Normocephalic.  Eyes:     Pupils: Pupils are equal, round, and reactive to light.  Cardiovascular:     Rate and Rhythm: Normal rate and regular rhythm.     Pulses: Normal pulses.     Heart sounds: Normal heart sounds. No murmur heard.    No friction rub. No gallop.  Pulmonary:     Effort: Pulmonary effort is normal.     Breath sounds: Normal breath sounds.  Abdominal:     General: Abdomen is flat. There is no distension.      Palpations: Abdomen is soft.     Tenderness: There is abdominal tenderness. There is no guarding or rebound.     Comments: Epigastric and RUQ tenderness  Musculoskeletal:        General: Normal range of motion.     Cervical back: Neck supple.  Skin:    General: Skin is warm and dry.  Neurological:     General: No focal deficit present.     Mental Status: He is alert.  Psychiatric:        Mood and Affect: Mood normal.        Behavior: Behavior normal.     (all labs ordered are listed, but only abnormal results are displayed) Labs Reviewed  COMPREHENSIVE METABOLIC PANEL WITH GFR - Abnormal; Notable for the following components:      Result Value   AST 14 (*)    All other components within normal limits  URINALYSIS, ROUTINE W REFLEX MICROSCOPIC - Abnormal; Notable for the following components:   Ketones, ur 15 (*)    All other components within normal limits  LIPASE, BLOOD  CBC WITH DIFFERENTIAL/PLATELET  TROPONIN T, HIGH SENSITIVITY  TROPONIN T, HIGH SENSITIVITY    EKG: EKG Interpretation Date/Time:  Saturday June 13 2024 11:55:58 EDT Ventricular Rate:  58 PR Interval:  143 QRS Duration:  95 QT Interval:  458 QTC Calculation: 450 R Axis:   88  Text Interpretation: Sinus rhythm ST elev, probable normal early repol pattern No significant change since last tracing Confirmed by Doretha Folks ([id number]) on 06/13/2024 3:26:52 PM  Radiology: CT ABDOMEN PELVIS W CONTRAST Result Date: 06/13/2024 CLINICAL DATA:  Provided history: Pancreatitis, acute, severe Radiologic records indicate right upper quadrant pain. EXAM: CT ABDOMEN AND PELVIS WITH CONTRAST TECHNIQUE: Multidetector CT imaging of the abdomen and pelvis was performed using the standard protocol following bolus administration of intravenous contrast. RADIATION DOSE REDUCTION: This exam was performed according to the departmental dose-optimization program which includes automated exposure control, adjustment of the mA  and/or kV according to patient size and/or use of iterative reconstruction technique. CONTRAST:  OMNIPAQUE  IOHEXOL  300 MG/ML  SOLN COMPARISON:  Ultrasound earlier today.  CT 09/08/2023 FINDINGS: Lower chest: Subsegmental atelectasis in the right lower lobe. No pleural effusion. Hepatobiliary: Scattered tiny hepatic hypodensities are too small to characterize. No suspicious liver lesion. Gallbladder physiologically distended, no calcified stone. No biliary dilatation. Pancreas: Homogeneous attenuation and enhancement. No peripancreatic fat stranding or inflammation. No evidence of pancreatic mass or focal fluid collection. Pancreatic duct is upper normal at 3 mm. Spleen: Normal in size without focal abnormality. Adrenals/Urinary Tract: 2.9 cm left adrenal nodule is  unchanged from prior exam. The right adrenal gland is normal. No hydronephrosis or perinephric edema. Homogeneous renal enhancement with symmetric excretion on delayed phase imaging. Urinary bladder is partially distended. No bladder wall thickening for degree of distension. Stomach/Bowel: Prominent wall thickening about the distal stomach with adjacent fat stranding, for example series 2, image 35. Outpouching containing fluid and air, series 2, image 32 and sagittal series 5, image 62 may represent is suspicious for ulcer and contained perforation. Mild wall hyperemia. No free air. No small bowel distension or obstruction. Moderate volume of stool in the right colon. The appendix is not visualized, history of appendectomy per electronic records. Vascular/Lymphatic: Aortic atherosclerosis. No aneurysm. The portal and splenic veins are patent. Few prominent and enlarged upper abdominal lymph nodes. This includes 11 mm perigastric node series 2, image 38 and peripancreatic/perigastric node measuring 8 mm, series 2, image 27. Reproductive: Prostate is unremarkable. Other: No free air.  No free fluid.  No abdominal wall hernia. Musculoskeletal: Mild  L4-L5 degenerative disc disease. There are no acute or suspicious osseous abnormalities. IMPRESSION: 1. Prominent wall thickening about the distal stomach with adjacent fat stranding. Outpouching containing fluid and air may represent ulcer and contained perforation. No free air. Recommend GI and consider surgical consultation. 2. Few prominent and enlarged upper abdominal lymph nodes are likely reactive. 3. No CT findings of acute pancreatitis. 4. Stable 2.9 cm left adrenal nodule, likely adenoma. Aortic Atherosclerosis (ICD10-I70.0). Electronically Signed   By: Andrea Gasman M.D.   On: 06/13/2024 15:47   US  Abdomen Limited RUQ (LIVER/GB) Result Date: 06/13/2024 CLINICAL DATA:  151471 RUQ pain 151471 EXAM: ULTRASOUND ABDOMEN LIMITED RIGHT UPPER QUADRANT COMPARISON:  September 08, 2023 FINDINGS: Gallbladder: Small volume biliary sludge. No gallstones. No wall thickening or pericholecystic fluid. No sonographic Murphy's sign noted by sonographer. Common bile duct: Diameter: 2 mm Liver: Normal echogenicity. No focal lesion identified. No intrahepatic biliary ductal dilation. Portal vein is patent on color Doppler imaging with normal direction of blood flow towards the liver. Other: None. IMPRESSION: Small volume layering biliary sludge. No cholecystolithiasis or changes of acute cholecystitis. Electronically Signed   By: Rogelia Myers M.D.   On: 06/13/2024 14:17     Procedures   Medications Ordered in the ED  piperacillin-tazobactam (ZOSYN) IVPB 3.375 g (has no administration in time range)    Followed by  piperacillin-tazobactam (ZOSYN) IVPB 3.375 g (has no administration in time range)  pantoprazole  (PROTONIX ) injection 40 mg (has no administration in time range)  sodium chloride  0.9 % bolus 1,000 mL (0 mLs Intravenous Stopped 06/13/24 1254)  morphine  (PF) 4 MG/ML injection 4 mg (4 mg Intravenous Given 06/13/24 1144)  HYDROmorphone (DILAUDID) injection 1 mg (1 mg Intravenous Given 06/13/24 1251)   iohexol  (OMNIPAQUE ) 300 MG/ML solution 100 mL (100 mLs Intravenous Contrast Given 06/13/24 1505)    Clinical Course as of 06/13/24 1651  Sat Jun 13, 2024  1431 Patient still in significant amount of pain. US  without evidence of acute cholecystitis. CT abdomen ordered.  [CA]    Clinical Course User Index [CA] Lorelle Aleck BROCKS, [name]                                 Medical Decision Making Amount and/or Complexity of Data Reviewed Independent Historian: spouse Labs: ordered. Decision-making details documented in ED Course. Radiology: ordered and independent interpretation performed. Decision-making details documented in ED Course. ECG/medicine tests: ordered and independent interpretation  performed. Decision-making details documented in ED Course.  Risk Prescription drug management. Decision regarding hospitalization.   This patient presents to the ED for concern of upper abdominal pain, this involves an extensive number of treatment options, and is a complaint that carries with it a high risk of complications and morbidity.  The differential diagnosis includes acute cholecystitis, pancreatitis, gastritis, ACS, PE, etc  53 year old male presents to the ED due to upper abdominal pain x 2 weeks associated with nausea and vomiting.  Has had similar episodes in the past.  Gallbladder intact.  Upon arrival patient afebrile, not tachycardic or hypoxic.  Patient in no acute distress.  Abdomen soft, nondistended with tenderness in epigastrium and right upper quadrant.  Routine labs ordered.  Ultrasound to rule out evidence of acute cholecystitis.  EKG and troponin to rule out cardiac etiology however, appears atypical in nature.  IV fluids and morphine  given.  CBC unremarkable.  No leukocytosis.  Normal hemoglobin.  CMP with normal renal function.  No major electrolyte derangements.  Lipase normal.  Low suspicion for pancreatitis.  UA negative for signs of infection.  Troponin normal.  EKG normal  sinus rhythm.  Low suspicion for ACS.  Ultrasound reviewed and interpreted which demonstrates biliary sludge however, no evidence of acute cholecystitis.  Given patient is still having significant amount of pain will obtain CT abdomen.  CT abdomen demonstrates a possible ulcer and contained perforation.  No free air.  IV antibiotics started.  Discussed with Dr. Sebastian with general surgery who recommends admission to medicine for IV antibiotics. General surgery will see patient.   4:34 PM Discussed with Dr. Perri with [facility] who agrees to admit patient. Patient started on protonix  40mg  BID.   Co morbidities that complicate the patient evaluation  asthma Cardiac Monitoring: / EKG:  The patient was maintained on a cardiac monitor.  I personally viewed and interpreted the cardiac monitored which showed an underlying rhythm of: NSR  Social Determinants of Health:  Tobacco use  Test / Admission - Considered:  Patient will require admission for perforated ulcer     Final diagnoses:  Perforated ulcer [facility])    ED Discharge Orders     None          Lorelle Aleck JAYSON DEVONNA 06/13/24 1652    Doretha Folks, MD 06/14/24 [phone]

## 2024-06-13 NOTE — H&P (Signed)
 History and Physical    Patient: Jeremy Randall FMW:[id number] DOB: Jan 19, 1971 DOA: 06/13/2024 DOS: the patient was seen and examined on 06/13/2024 PCP: Celestia Rosaline SQUIBB, NP  Patient coming from: Home  Chief Complaint:  Chief Complaint  Patient presents with   Abdominal Pain   HPI: Jeremy Randall is a 53 y.o. male with medical history significant of asthma, atrial fibrillation, prolonged QT interval, tobacco abuse, history of dental abscess, who was seen at [facility] with intractable nausea vomiting and abdominal pain.  Symptoms have been going on and off for about 2 weeks.  Denied NSAID use.  Denied significant alcohol intake.  Patient was seen and evaluated.  He had a CT abdomen pelvis that showed thickening of his distal stomach and duodenum.  It is suggested likely contained perforated peptic ulcer.  Surgery was consulted and patient was transferred to our service for further evaluation and treatment.  Denied any hematemesis or melena.  Pain is currently a 6 out of 10.  Patient has responded to initial pain medications.  Will admit the patient with surgical consult.  Review of Systems: As mentioned in the history of present illness. All other systems reviewed and are negative. Past Medical History:  Diagnosis Date   Asthma    Past Surgical History:  Procedure Laterality Date   APPENDECTOMY     PATELLAR TENDON REPAIR     Social History:  reports that he has been smoking cigarettes. He has a 12.5 pack-year smoking history. He has never used smokeless tobacco. He reports current alcohol use. He reports that he does not currently use drugs after having used the following drugs: Marijuana.  No Known Allergies  Family History  Problem Relation Age of Onset   Diabetes Mother    Hypertension Mother    Cancer Sister    Hypertension Sister    Hypertension Father    Diabetes Maternal Aunt     Prior to Admission medications   Medication Sig Start Date End Date Taking? Authorizing  Provider  amoxicillin -clavulanate (AUGMENTIN ) 875-125 MG tablet Take 1 tablet by mouth 2 (two) times daily. 09/20/23   Celestia Rosaline SQUIBB, NP  dicyclomine  (BENTYL ) 20 MG tablet Take 1 tablet (20 mg total) by mouth 2 (two) times daily as needed (abdominal spasms). 09/12/23   Geiple, Joshua, [name]  dicyclomine  (BENTYL ) 20 MG tablet Take 1 tablet (20 mg total) by mouth 3 (three) times daily as needed for abdominal pain. 10/03/23     famotidine  (PEPCID ) 20 MG tablet Take 1 tablet (20 mg total) by mouth 2 (two) times daily. 09/12/23   Geiple, Joshua, [name]  famotidine  (PEPCID ) 20 MG tablet Take 1 tablet (20 mg total) by mouth 2 (two) times daily. 09/25/23     ondansetron  (ZOFRAN -ODT) 4 MG disintegrating tablet Take 1 tablet (4 mg total) by mouth every 8 (eight) hours as needed for nausea or vomiting. 09/08/23   Sofia, Leslie K, [name]  ondansetron  (ZOFRAN -ODT) 4 MG disintegrating tablet Dissolve 1 tablet (4 mg total) by mouth every 8 (eight) hours as needed for nausea and vomiting. 03/18/24   Kathrine Rosaline, NP  oxyCODONE  (OXY IR/ROXICODONE ) 5 MG immediate release tablet Take 1 tablet (5 mg total) by mouth every 6 (six) hours as needed for severe pain (pain score 7-10). 09/12/23   Geiple, Joshua, [name]  pantoprazole  (PROTONIX ) 40 MG tablet Take 1 tablet (40 mg total) by mouth daily. 09/12/23   Desiderio Chew, [name]  pantoprazole  (PROTONIX ) 40 MG tablet Take 1 tablet (40 mg total) by  mouth in the morning 1/2 to 1 hour before morning meal. 10/03/23     pantoprazole  (PROTONIX ) 40 MG tablet Take 1 tablet (40 mg total) by mouth every morning before breakfast. 03/18/24   Kathrine Browning, NP  pravastatin  (PRAVACHOL ) 20 MG tablet Take 1 tablet (20 mg total) by mouth daily. 12/05/22   Celestia Browning SQUIBB, NP  sucralfate  (CARAFATE ) 1 g tablet Take 1 tablet (1 g total) by mouth 4 (four) times daily -  with meals and at bedtime. 09/12/23   Geiple, Joshua, [name]  sucralfate  (CARAFATE ) 1 g tablet Take 1 tablet (1 g total) by mouth 3  (three) times daily on an empty stomach. 10/03/23       Physical Exam: Vitals:   06/13/24 1700 06/13/24 1806 06/13/24 1806 06/13/24 1930  BP: (!) 181/96  (!) 143/85 (!) 161/86  Pulse: (!) 56  (!) 56 74  Resp: 15  19 20   Temp:   98.4 F (36.9 C) 97.9 F (36.6 C)  TempSrc:    Oral  SpO2: 100%  95% 100%  Weight:      Height:  6' 2 (1.88 m)     Constitutional: Acutely ill looking, NAD, calm, comfortable Eyes: PERRL, lids and conjunctivae normal ENMT: Mucous membranes are moist. Posterior pharynx clear of any exudate or lesions.Normal dentition.  Neck: normal, supple, no masses, no thyromegaly Respiratory: clear to auscultation bilaterally, no wheezing, no crackles. Normal respiratory effort. No accessory muscle use.  Cardiovascular: Regular rate and rhythm, no murmurs / rubs / gallops. No extremity edema. 2+ pedal pulses. No carotid bruits.  Abdomen: Mildly distended with diffuse tenderness in the epigastric region no masses palpated. No hepatosplenomegaly. Bowel sounds positive.  Musculoskeletal: Good range of motion, no joint swelling or tenderness, Skin: no rashes, lesions, ulcers. No induration Neurologic: CN 2-12 grossly intact. Sensation intact, DTR normal. Strength 5/5 in all 4.  Psychiatric: Normal judgment and insight. Alert and oriented x 3. Normal mood  Data Reviewed:  Temperature 98.4, blood pressure 182/98, pulse 74.  CBC and chemistry appear to be within normal with a white count of 6.6.  Abdominal ultrasound shows small volume biliary sludge no cholecystitis.  CT abdomen pelvis showed prominent wall thickening about the distal stomach with adjacent fat stranding.  Outpouching containing fluid and air may represent ulcer or contained perforation.  No free air.  Also few prominent and enlarged upper abdominal lymph nodes which are reactive.  2.9 cm left adrenal adenoma  Assessment and Plan:  #1 acute contained perforated peptic ulcer: Surgery has been consulted and Dr.  Sebastian aware.  Will admit the patient.  Supportive care.  Clear liquid diet.  Patient will get pain management and IV Protonix .  Continue with symptomatic management.  #2 history of asthma: No acute exacerbation.  #3 tobacco abuse: Counseling provided.  Will offer nicotine patch.  #4 Elevated Blood Pressure: Patient has no history of hypertension.  Will monitor blood pressure and treat if needed.    Advance Care Planning:   Code Status: Not on file full code  Consults: Dr. Dann Sebastian, general surgery  Family Communication: No family at bedside  Severity of Illness: The appropriate patient status for this patient is INPATIENT. Inpatient status is judged to be reasonable and necessary in order to provide the required intensity of service to ensure the patient's safety. The patient's presenting symptoms, physical exam findings, and initial radiographic and laboratory data in the context of their chronic comorbidities is felt to place them at high risk  for further clinical deterioration. Furthermore, it is not anticipated that the patient will be medically stable for discharge from the hospital within 2 midnights of admission.   * I certify that at the point of admission it is my clinical judgment that the patient will require inpatient hospital care spanning beyond 2 midnights from the point of admission due to high intensity of service, high risk for further deterioration and high frequency of surveillance required.*  AuthorBETHA SIM KNOLL, MD 06/13/2024 7:52 PM  For on call review [email or web address].

## 2024-06-13 NOTE — ED Notes (Signed)
Jeremy Randall with cl called for transport

## 2024-06-13 NOTE — Plan of Care (Signed)

## 2024-06-13 NOTE — Treatment Plan (Signed)
 [facility] to [facility]  53 yo with hx asthma, gastritis, atrial fibrillation, tobacco abuse who presented with abdominal pain, N/V, fatigue x2 weeks.  CT showed prominent wall thickening about the distal stomach with adjacent fat stranding, outpouching containing fluid and air may represent ulcer and contained perforation.  EDP discussed with Dr. Sebastian of general surgery, they'll see on arrival.  Vitals with bradycardia, hypertension, normal sats on RA and normal RR.  Labs unremarkable (normal CBC, normal CMP, negative troponin x2, negative lipase, UA with ketones).  Started on abx by EDP.  Requested PPI.    Will admit to med tele.

## 2024-06-13 NOTE — Consult Note (Signed)
 Reason for Consult:contained perforated ulcer Referring Physician: [name] is an 53 y.o. male.  HPI: 53yo M presented to [facility] C/O a 2-week history of increasing epigastric abdominal pain.  For the past few days he has also had nausea and vomiting.  He denies any hematemesis.  Workup revealed thickening of his distal stomach and duodenum with a likely contained perforated ulcer.  He was accepted in transfer for admission to the hospitalist service.  I am seeing him for surgical management.  He has never had an upper endoscopy before.  Past Medical History:  Diagnosis Date   Asthma     Past Surgical History:  Procedure Laterality Date   APPENDECTOMY     PATELLAR TENDON REPAIR      Family History  Problem Relation Age of Onset   Diabetes Mother    Hypertension Mother    Cancer Sister    Hypertension Sister    Hypertension Father    Diabetes Maternal Aunt     Social History:  reports that he has been smoking cigarettes. He has a 12.5 pack-year smoking history. He has never used smokeless tobacco. He reports current alcohol use. He reports that he does not currently use drugs after having used the following drugs: Marijuana.  Allergies: No Known Allergies  Medications: I have reviewed the patient's current medications.  Results for orders placed or performed during the hospital encounter of 06/13/24 (from the past 48 hours)  Comprehensive metabolic panel     Status: Abnormal   Collection Time: 06/13/24 11:21 AM  Result Value Ref Range   Sodium 136 135 - 145 mmol/L   Potassium 3.8 3.5 - 5.1 mmol/L   Chloride 99 98 - 111 mmol/L   CO2 26 22 - 32 mmol/L   Glucose, Bld 99 70 - 99 mg/dL    Comment: Glucose reference range applies only to samples taken after fasting for at least 8 hours.   BUN 7 6 - 20 mg/dL   Creatinine, Ser 9.04 0.61 - 1.24 mg/dL   Calcium 9.9 8.9 - 89.6 mg/dL   Total Protein 7.8 6.5 - 8.1 g/dL   Albumin 4.3 3.5 - 5.0 g/dL    AST 14 (L) 15 - 41 U/L   ALT 8 0 - 44 U/L   Alkaline Phosphatase 80 38 - 126 U/L   Total Bilirubin 0.6 0.0 - 1.2 mg/dL   GFR, Estimated >39 >39 mL/min    Comment: (NOTE) Calculated using the CKD-EPI Creatinine Equation (2021)    Anion gap 11 5 - 15    Comment: Performed at [facility], [address] [place], [place] [address]  Lipase, blood     Status: None   Collection Time: 06/13/24 11:21 AM  Result Value Ref Range   Lipase 30 11 - 51 U/L    Comment: Performed at [facility], [address], [place], [place] [address]  CBC with Diff     Status: None   Collection Time: 06/13/24 11:21 AM  Result Value Ref Range   WBC 6.6 4.0 - 10.5 K/uL   RBC 5.04 4.22 - 5.81 MIL/uL   Hemoglobin 15.0 13.0 - 17.0 g/dL   HCT 56.4 60.9 - 47.9 %   MCV 86.3 80.0 - 100.0 fL   MCH 29.8 26.0 - 34.0 pg   MCHC 34.5 30.0 - 36.0 g/dL   RDW 86.4 88.4 - 84.4 %   Platelets 243 150 - 400 K/uL   nRBC 0.0 0.0 -  0.2 %   Neutrophils Relative % 55 %   Neutro Abs 3.7 1.7 - 7.7 K/uL   Lymphocytes Relative 28 %   Lymphs Abs 1.9 0.7 - 4.0 K/uL   Monocytes Relative 13 %   Monocytes Absolute 0.8 0.1 - 1.0 K/uL   Eosinophils Relative 2 %   Eosinophils Absolute 0.2 0.0 - 0.5 K/uL   Basophils Relative 1 %   Basophils Absolute 0.0 0.0 - 0.1 K/uL   Immature Granulocytes 1 %   Abs Immature Granulocytes 0.03 0.00 - 0.07 K/uL    Comment: Performed at [facility], [address], [place], [place] [address]  Troponin T, High Sensitivity     Status: None   Collection Time: 06/13/24 11:35 AM  Result Value Ref Range   Troponin T High Sensitivity <15 0 - 19 ng/L    Comment: (NOTE) Biotin concentrations > 1000 ng/mL falsely decrease TnT results.  Serial cardiac troponin measurements are suggested.  Refer to the Links section for chest pain algorithms and additional  guidance. Performed at [facility], [address] [place], [place]  [address]   Urinalysis, Routine w reflex microscopic -Urine, Clean Catch     Status: Abnormal   Collection Time: 06/13/24  2:23 PM  Result Value Ref Range   Color, Urine YELLOW YELLOW   APPearance CLEAR CLEAR   Specific Gravity, Urine 1.015 1.005 - 1.030   pH 7.0 5.0 - 8.0   Glucose, UA NEGATIVE NEGATIVE mg/dL   Hgb urine dipstick NEGATIVE NEGATIVE   Bilirubin Urine NEGATIVE NEGATIVE   Ketones, ur 15 (A) NEGATIVE mg/dL   Protein, ur NEGATIVE NEGATIVE mg/dL   Nitrite NEGATIVE NEGATIVE   Leukocytes,Ua NEGATIVE NEGATIVE    Comment: Performed at [facility], [address] [place], [place] [address]  Troponin T, High Sensitivity     Status: None   Collection Time: 06/13/24  2:23 PM  Result Value Ref Range   Troponin T High Sensitivity <15 0 - 19 ng/L    Comment: (NOTE) Biotin concentrations > 1000 ng/mL falsely decrease TnT results.  Serial cardiac troponin measurements are suggested.  Refer to the Links section for chest pain algorithms and additional  guidance. Performed at [facility], [address], [place], [place] [address]     CT ABDOMEN PELVIS W CONTRAST Result Date: 06/13/2024 CLINICAL DATA:  Provided history: Pancreatitis, acute, severe Radiologic records indicate right upper quadrant pain. EXAM: CT ABDOMEN AND PELVIS WITH CONTRAST TECHNIQUE: Multidetector CT imaging of the abdomen and pelvis was performed using the standard protocol following bolus administration of intravenous contrast. RADIATION DOSE REDUCTION: This exam was performed according to the departmental dose-optimization program which includes automated exposure control, adjustment of the mA and/or kV according to patient size and/or use of iterative reconstruction technique. CONTRAST:  OMNIPAQUE  IOHEXOL  300 MG/ML  SOLN COMPARISON:  Ultrasound earlier today.  CT 09/08/2023 FINDINGS: Lower chest: Subsegmental atelectasis in the right lower lobe. No pleural effusion.  Hepatobiliary: Scattered tiny hepatic hypodensities are too small to characterize. No suspicious liver lesion. Gallbladder physiologically distended, no calcified stone. No biliary dilatation. Pancreas: Homogeneous attenuation and enhancement. No peripancreatic fat stranding or inflammation. No evidence of pancreatic mass or focal fluid collection. Pancreatic duct is upper normal at 3 mm. Spleen: Normal in size without focal abnormality. Adrenals/Urinary Tract: 2.9 cm left adrenal nodule is unchanged from prior exam. The right adrenal gland is normal. No hydronephrosis or perinephric edema. Homogeneous renal enhancement with symmetric excretion on delayed  phase imaging. Urinary bladder is partially distended. No bladder wall thickening for degree of distension. Stomach/Bowel: Prominent wall thickening about the distal stomach with adjacent fat stranding, for example series 2, image 35. Outpouching containing fluid and air, series 2, image 32 and sagittal series 5, image 62 may represent is suspicious for ulcer and contained perforation. Mild wall hyperemia. No free air. No small bowel distension or obstruction. Moderate volume of stool in the right colon. The appendix is not visualized, history of appendectomy per electronic records. Vascular/Lymphatic: Aortic atherosclerosis. No aneurysm. The portal and splenic veins are patent. Few prominent and enlarged upper abdominal lymph nodes. This includes 11 mm perigastric node series 2, image 38 and peripancreatic/perigastric node measuring 8 mm, series 2, image 27. Reproductive: Prostate is unremarkable. Other: No free air.  No free fluid.  No abdominal wall hernia. Musculoskeletal: Mild L4-L5 degenerative disc disease. There are no acute or suspicious osseous abnormalities. IMPRESSION: 1. Prominent wall thickening about the distal stomach with adjacent fat stranding. Outpouching containing fluid and air may represent ulcer and contained perforation. No free air.  Recommend GI and consider surgical consultation. 2. Few prominent and enlarged upper abdominal lymph nodes are likely reactive. 3. No CT findings of acute pancreatitis. 4. Stable 2.9 cm left adrenal nodule, likely adenoma. Aortic Atherosclerosis (ICD10-I70.0). Electronically Signed   By: Andrea Gasman M.D.   On: 06/13/2024 15:47   US  Abdomen Limited RUQ (LIVER/GB) Result Date: 06/13/2024 CLINICAL DATA:  151471 RUQ pain 151471 EXAM: ULTRASOUND ABDOMEN LIMITED RIGHT UPPER QUADRANT COMPARISON:  September 08, 2023 FINDINGS: Gallbladder: Small volume biliary sludge. No gallstones. No wall thickening or pericholecystic fluid. No sonographic Murphy's sign noted by sonographer. Common bile duct: Diameter: 2 mm Liver: Normal echogenicity. No focal lesion identified. No intrahepatic biliary ductal dilation. Portal vein is patent on color Doppler imaging with normal direction of blood flow towards the liver. Other: None. IMPRESSION: Small volume layering biliary sludge. No cholecystolithiasis or changes of acute cholecystitis. Electronically Signed   By: Rogelia Myers M.D.   On: 06/13/2024 14:17    Review of Systems  Constitutional:  Positive for appetite change.  HENT: Negative.    Eyes: Negative.   Respiratory: Negative.    Cardiovascular: Negative.   Gastrointestinal:  Positive for abdominal pain, nausea and vomiting.  Endocrine: Negative.   Genitourinary: Negative.   Musculoskeletal: Negative.   Allergic/Immunologic: Negative.   Neurological: Negative.   Hematological: Negative.   Psychiatric/Behavioral: Negative.     Blood pressure (!) 143/85, pulse (!) 56, temperature 98.4 F (36.9 C), resp. rate 19, height 6' 2 (1.88 m), weight 89.4 kg, SpO2 95%. Physical Exam HENT:     Head: Normocephalic.  Cardiovascular:     Rate and Rhythm: Normal rate and regular rhythm.  Pulmonary:     Effort: Pulmonary effort is normal.     Breath sounds: Normal breath sounds. No wheezing.  Abdominal:      General: Abdomen is flat. There is no distension.     Tenderness: There is abdominal tenderness in the epigastric area. There is no guarding or rebound.  Skin:    General: Skin is warm.  Neurological:     Mental Status: He is alert and oriented to person, place, and time.  Psychiatric:        Mood and Affect: Mood normal.     Assessment/Plan: Abdominal pain, nausea and vomiting CT scan shows contained perforated ulcer, no free air  Agree with medical admission, PPI,NPO, IV antibiotics.  No need for  emergent surgery.  We will follow closely.  He may need an upper GI in the coming days for further evaluation after there is some time for the perforation to begin healing.  I discussed the plan in detail with him.  Jeremy Randall 06/13/2024, 6:41 PM

## 2024-06-13 NOTE — Plan of Care (Signed)
   Problem: Education: Goal: Knowledge of General Education information will improve Description Including pain rating scale, medication(s)/side effects and non-pharmacologic comfort measures Outcome: Progressing

## 2024-06-13 NOTE — ED Triage Notes (Signed)
 Pt c/o generalized abd pain with n/v and fatigue x 2  weeks, not responding to prescribed meds

## 2024-06-13 NOTE — ED Notes (Signed)
 Pt unable to provide urine sample at this time

## 2024-06-14 LAB — CBC
HCT: 39.7 % (ref 39.0–52.0)
Hemoglobin: 13.8 g/dL (ref 13.0–17.0)
MCH: 29.6 pg (ref 26.0–34.0)
MCHC: 34.8 g/dL (ref 30.0–36.0)
MCV: 85 fL (ref 80.0–100.0)
Platelets: 236 K/uL (ref 150–400)
RBC: 4.67 MIL/uL (ref 4.22–5.81)
RDW: 13.2 % (ref 11.5–15.5)
WBC: 5.9 K/uL (ref 4.0–10.5)
nRBC: 0 % (ref 0.0–0.2)

## 2024-06-14 LAB — COMPREHENSIVE METABOLIC PANEL WITH GFR
ALT: 6 U/L (ref 0–44)
AST: 11 U/L — ABNORMAL LOW (ref 15–41)
Albumin: 3.2 g/dL — ABNORMAL LOW (ref 3.5–5.0)
Alkaline Phosphatase: 54 U/L (ref 38–126)
Anion gap: 8 (ref 5–15)
BUN: 7 mg/dL (ref 6–20)
CO2: 23 mmol/L (ref 22–32)
Calcium: 8.7 mg/dL — ABNORMAL LOW (ref 8.9–10.3)
Chloride: 102 mmol/L (ref 98–111)
Creatinine, Ser: 0.99 mg/dL (ref 0.61–1.24)
GFR, Estimated: >60 mL/min (ref >60)
Glucose, Bld: 111 mg/dL — ABNORMAL HIGH (ref 70–99)
Potassium: 3.7 mmol/L (ref 3.5–5.1)
Sodium: 133 mmol/L — ABNORMAL LOW (ref 135–145)
Total Bilirubin: 0.7 mg/dL (ref 0.0–1.2)
Total Protein: 6.4 g/dL — ABNORMAL LOW (ref 6.5–8.1)

## 2024-06-14 LAB — HIV ANTIBODY (ROUTINE TESTING W REFLEX): HIV Screen 4th Generation wRfx: NONREACTIVE

## 2024-06-14 MED ORDER — NICOTINE 14 MG/24HR TD PT24
14.0000 mg | MEDICATED_PATCH | Freq: Every day | TRANSDERMAL | Status: DC
  Administered 2024-06-14 – 2024-06-16 (×3): 14 mg via TRANSDERMAL
  Filled 2024-06-14 (×3): qty 1

## 2024-06-14 MED ORDER — DEXTROSE IN LACTATED RINGERS 5 % IV SOLN: INTRAVENOUS | Status: DC

## 2024-06-14 NOTE — Plan of Care (Signed)
  Problem: Health Behavior/Discharge Planning: Goal: Ability to manage health-related needs will improve Outcome: Progressing   Problem: Clinical Measurements: Goal: Respiratory complications will improve Outcome: Progressing   Problem: Activity: Goal: Risk for activity intolerance will decrease Outcome: Progressing   Problem: Nutrition: Goal: Adequate nutrition will be maintained Outcome: Progressing

## 2024-06-14 NOTE — Consult Note (Signed)
 [facility] Gastroenterology Consult  Referring Provider: No ref. provider found Primary Care Physician:  Celestia Rosaline SQUIBB, NP Primary Gastroenterologist: [facility]   Reason for Consultation: perforated peptic ulcer  SUBJECTIVE:   HPI: Jeremy Randall is a 53 y.o. male with past medical history significant for asthma and tobacco use who presented to hospital with chief complaint of nausea and abdominal pain.  Patient seen and evaluated at bedside, noted that he began to have epigastric pain roughly 3 weeks prior. Pain became progressively worse. The 3 days prior to presentation to hospital he experienced nausea and emesis. He had dark colored stools, though not melanic. He uses famotidine  PRN. No PPI use. Used ibuprofen  few days prior to presentation for abdominal pain which did not help his symptoms. No chest pain or shortness of breath.  CT scan of abdomen and pelvis 06/13/24 showed prominent wall thickening about the distal stomach with adjacent fat stranding, outpouching containing fluid and air suspicious for ulcer and contained perforation, mild wall hyperemia, no free air, moderate volume of stool in right colon.  Abdominal ultrasound 06/13/24 small volume biliary sludge, no cholelithiasis, common bile duct 2 mm, normal liver echogenicity.  Labs Hgb 13.8, WBC 5.9, PLT 236.  General Surgery evaluation reviewed, no emergent surgery needed, recommendations for NPO, IV antibiotics. Consider upper GI study in few days.  Seen by [facility] in office (NP [place]) on 09/25/2023 for follow up from ED visit for abdominal pain. At that time, he described lower abdominal and epigastric abdominal pain, though improved/resolved with H2RA therapy. Was taking famotidine , ran out of pantoprazole , Bentyl . Denied melena, hematochezia, NSAID use. He was recommended to continue famotidine . He was recommended to have EGD completed and colonoscopy (for colon cancer screening). Procedures were not completed secondary to  cost (self-pay).  Past Medical History:  Diagnosis Date   Asthma    Past Surgical History:  Procedure Laterality Date   APPENDECTOMY     PATELLAR TENDON REPAIR     Prior to Admission medications   Medication Sig Start Date End Date Taking? Authorizing Provider  amoxicillin -clavulanate (AUGMENTIN ) 875-125 MG tablet Take 1 tablet by mouth 2 (two) times daily. 09/20/23   Celestia Rosaline SQUIBB, NP  dicyclomine  (BENTYL ) 20 MG tablet Take 1 tablet (20 mg total) by mouth 2 (two) times daily as needed (abdominal spasms). 09/12/23   Geiple, Joshua, [name]  dicyclomine  (BENTYL ) 20 MG tablet Take 1 tablet (20 mg total) by mouth 3 (three) times daily as needed for abdominal pain. 10/03/23     famotidine  (PEPCID ) 20 MG tablet Take 1 tablet (20 mg total) by mouth 2 (two) times daily. 09/12/23   Geiple, Joshua, [name]  famotidine  (PEPCID ) 20 MG tablet Take 1 tablet (20 mg total) by mouth 2 (two) times daily. 09/25/23     ondansetron  (ZOFRAN -ODT) 4 MG disintegrating tablet Take 1 tablet (4 mg total) by mouth every 8 (eight) hours as needed for nausea or vomiting. 09/08/23   Sofia, Leslie K, [name]  ondansetron  (ZOFRAN -ODT) 4 MG disintegrating tablet Dissolve 1 tablet (4 mg total) by mouth every 8 (eight) hours as needed for nausea and vomiting. 03/18/24   Kathrine Rosaline, NP  oxyCODONE  (OXY IR/ROXICODONE ) 5 MG immediate release tablet Take 1 tablet (5 mg total) by mouth every 6 (six) hours as needed for severe pain (pain score 7-10). 09/12/23   Desiderio Chew, [name]  pantoprazole  (PROTONIX ) 40 MG tablet Take 1 tablet (40 mg total) by mouth daily. 09/12/23   Geiple, Joshua, [name]  pantoprazole  (PROTONIX )  40 MG tablet Take 1 tablet (40 mg total) by mouth in the morning 1/2 to 1 hour before morning meal. 10/03/23     pantoprazole  (PROTONIX ) 40 MG tablet Take 1 tablet (40 mg total) by mouth every morning before breakfast. 03/18/24   Kathrine Browning, NP  pravastatin  (PRAVACHOL ) 20 MG tablet Take 1 tablet (20 mg total) by mouth  daily. 12/05/22   Celestia Browning SQUIBB, NP  sucralfate  (CARAFATE ) 1 g tablet Take 1 tablet (1 g total) by mouth 4 (four) times daily -  with meals and at bedtime. 09/12/23   Geiple, Joshua, [name]  sucralfate  (CARAFATE ) 1 g tablet Take 1 tablet (1 g total) by mouth 3 (three) times daily on an empty stomach. 10/03/23      Current Facility-Administered Medications  Medication Dose Route Frequency Provider Last Rate Last Admin   dextrose 5 % in lactated ringers infusion   Intravenous Continuous Sim Emery CROME, MD 125 mL/hr at 06/14/24 1106 New Bag at 06/14/24 1106   metoprolol  tartrate (LOPRESSOR ) injection 5 mg  5 mg Intravenous Q6H PRN Sim Emery CROME, MD       morphine  (PF) 2 MG/ML injection 2 mg  2 mg Intravenous Q2H PRN Sim Emery CROME, MD   2 mg at 06/14/24 9170   Oral care mouth rinse  15 mL Mouth Rinse PRN Sim Emery CROME, MD       pantoprazole  (PROTONIX ) injection 40 mg  40 mg Intravenous Q12H Sim Emery L, MD   40 mg at 06/14/24 0829   piperacillin-tazobactam (ZOSYN) IVPB 3.375 g  3.375 g Intravenous Q8H Gaines Carrier, RPH 12.5 mL/hr at 06/14/24 0558 3.375 g at 06/14/24 0558   promethazine (PHENERGAN) tablet 12.5 mg  12.5 mg Oral Q6H PRN Sim Emery CROME, MD       Allergies as of 06/13/2024   (No Known Allergies)   Family History  Problem Relation Age of Onset   Diabetes Mother    Hypertension Mother    Cancer Sister    Hypertension Sister    Hypertension Father    Diabetes Maternal Aunt    Social History   Socioeconomic History   Marital status: Significant Other    Spouse name: Not on file   Number of children: Not on file   Years of education: Not on file   Highest education level: Not on file  Occupational History   Not on file  Tobacco Use   Smoking status: Every Day    Current packs/day: 0.50    Average packs/day: 0.5 packs/day for 25.0 years (12.5 ttl pk-yrs)    Types: Cigarettes   Smokeless tobacco: Never  Vaping Use   Vaping status: Never Used   Substance and Sexual Activity   Alcohol use: Yes    Comment: rare   Drug use: Not Currently    Types: Marijuana    Comment: occ   Sexual activity: Not on file  Other Topics Concern   Not on file  Social History Narrative   Not on file   Social Drivers of Health   Financial Resource Strain: Not on file  Food Insecurity: No Food Insecurity (06/13/2024)   Hunger Vital Sign    Worried About Running Out of Food in the Last Year: Never true    Ran Out of Food in the Last Year: Never true  Transportation Needs: No Transportation Needs (06/13/2024)   PRAPARE - Administrator, Civil Service (Medical): No    Lack of Transportation (Non-Medical): No  Physical Activity: Not on file  Stress: Not on file  Social Connections: Unknown (01/23/2022)   Received from [facility]   Social Network    Social Network: Not on file  Intimate Partner Violence: Not At Risk (06/13/2024)   Humiliation, Afraid, Rape, and Kick questionnaire    Fear of Current or Ex-Partner: No    Emotionally Abused: No    Physically Abused: No    Sexually Abused: No   Review of Systems:  Review of Systems  Constitutional:  Positive for weight loss (over past 3 weeks with reduced appetite/pain).  Respiratory:  Negative for shortness of breath.   Cardiovascular:  Negative for chest pain.  Gastrointestinal:  Positive for abdominal pain, nausea and vomiting. Negative for constipation and diarrhea.    OBJECTIVE:   Temp:  [97.9 F (36.6 C)-98.4 F (36.9 C)] 98.4 F (36.9 C) (10/05 0755) Pulse Rate:  [49-74] 55 (10/05 0755) Resp:  [13-20] 19 (10/05 0755) BP: (135-182)/(85-98) 157/90 (10/05 0755) SpO2:  [95 %-100 %] 100 % (10/05 0755) Last BM Date : 06/10/24 Physical Exam Constitutional:      General: He is not in acute distress.    Appearance: He is not ill-appearing, toxic-appearing or diaphoretic.  Cardiovascular:     Rate and Rhythm: Bradycardia present.  Pulmonary:     Effort: No respiratory  distress.     Breath sounds: Normal breath sounds.  Abdominal:     General: Bowel sounds are normal. There is no distension.     Palpations: Abdomen is soft.     Tenderness: There is abdominal tenderness. There is no guarding.  Musculoskeletal:     Right lower leg: No edema.     Left lower leg: No edema.  Skin:    General: Skin is warm and dry.  Neurological:     Mental Status: He is alert.     Labs: Recent Labs    06/13/24 1121 06/14/24 0306  WBC 6.6 5.9  HGB 15.0 13.8  HCT 43.5 39.7  PLT 243 236   BMET Recent Labs    06/13/24 1121 06/14/24 0306  NA 136 133*  K 3.8 3.7  CL 99 102  CO2 26 23  GLUCOSE 99 111*  BUN 7 7  CREATININE 0.95 0.99  CALCIUM 9.9 8.7*   LFT Recent Labs    06/14/24 0306  PROT 6.4*  ALBUMIN 3.2*  AST 11*  ALT 6  ALKPHOS 54  BILITOT 0.7   PT/INR No results for input(s): LABPROT, INR in the last 72 hours.  Diagnostic imaging: CT ABDOMEN PELVIS W CONTRAST Result Date: 06/13/2024 CLINICAL DATA:  Provided history: Pancreatitis, acute, severe Radiologic records indicate right upper quadrant pain. EXAM: CT ABDOMEN AND PELVIS WITH CONTRAST TECHNIQUE: Multidetector CT imaging of the abdomen and pelvis was performed using the standard protocol following bolus administration of intravenous contrast. RADIATION DOSE REDUCTION: This exam was performed according to the departmental dose-optimization program which includes automated exposure control, adjustment of the mA and/or kV according to patient size and/or use of iterative reconstruction technique. CONTRAST:  OMNIPAQUE  IOHEXOL  300 MG/ML  SOLN COMPARISON:  Ultrasound earlier today.  CT 09/08/2023 FINDINGS: Lower chest: Subsegmental atelectasis in the right lower lobe. No pleural effusion. Hepatobiliary: Scattered tiny hepatic hypodensities are too small to characterize. No suspicious liver lesion. Gallbladder physiologically distended, no calcified stone. No biliary dilatation. Pancreas:  Homogeneous attenuation and enhancement. No peripancreatic fat stranding or inflammation. No evidence of pancreatic mass or focal fluid collection. Pancreatic duct is upper  normal at 3 mm. Spleen: Normal in size without focal abnormality. Adrenals/Urinary Tract: 2.9 cm left adrenal nodule is unchanged from prior exam. The right adrenal gland is normal. No hydronephrosis or perinephric edema. Homogeneous renal enhancement with symmetric excretion on delayed phase imaging. Urinary bladder is partially distended. No bladder wall thickening for degree of distension. Stomach/Bowel: Prominent wall thickening about the distal stomach with adjacent fat stranding, for example series 2, image 35. Outpouching containing fluid and air, series 2, image 32 and sagittal series 5, image 62 may represent is suspicious for ulcer and contained perforation. Mild wall hyperemia. No free air. No small bowel distension or obstruction. Moderate volume of stool in the right colon. The appendix is not visualized, history of appendectomy per electronic records. Vascular/Lymphatic: Aortic atherosclerosis. No aneurysm. The portal and splenic veins are patent. Few prominent and enlarged upper abdominal lymph nodes. This includes 11 mm perigastric node series 2, image 38 and peripancreatic/perigastric node measuring 8 mm, series 2, image 27. Reproductive: Prostate is unremarkable. Other: No free air.  No free fluid.  No abdominal wall hernia. Musculoskeletal: Mild L4-L5 degenerative disc disease. There are no acute or suspicious osseous abnormalities. IMPRESSION: 1. Prominent wall thickening about the distal stomach with adjacent fat stranding. Outpouching containing fluid and air may represent ulcer and contained perforation. No free air. Recommend GI and consider surgical consultation. 2. Few prominent and enlarged upper abdominal lymph nodes are likely reactive. 3. No CT findings of acute pancreatitis. 4. Stable 2.9 cm left adrenal nodule,  likely adenoma. Aortic Atherosclerosis (ICD10-I70.0). Electronically Signed   By: Andrea Gasman M.D.   On: 06/13/2024 15:47   US  Abdomen Limited RUQ (LIVER/GB) Result Date: 06/13/2024 CLINICAL DATA:  151471 RUQ pain 151471 EXAM: ULTRASOUND ABDOMEN LIMITED RIGHT UPPER QUADRANT COMPARISON:  September 08, 2023 FINDINGS: Gallbladder: Small volume biliary sludge. No gallstones. No wall thickening or pericholecystic fluid. No sonographic Murphy's sign noted by sonographer. Common bile duct: Diameter: 2 mm Liver: Normal echogenicity. No focal lesion identified. No intrahepatic biliary ductal dilation. Portal vein is patent on color Doppler imaging with normal direction of blood flow towards the liver. Other: None. IMPRESSION: Small volume layering biliary sludge. No cholecystolithiasis or changes of acute cholecystitis. Electronically Signed   By: Rogelia Myers M.D.   On: 06/13/2024 14:17   IMPRESSION: Abnormal CT imaging Concern for perforated peptic ulcer, contained Epigastric abdominal pain, acute on chronic  Asthma  PLAN: -General Surgery evaluation/recommendations reviewed, NPO, IV antibiotics, likely upper GI imaging within the next few days -Check H.Pylori stool antigen -IV PPI Q12Hr -Serial abdominal exams -Eventual EGD, not in setting of acute/contained perforation -Eventual colonoscopy for colon cancer screening -[facility] will follow, Dr. Saintclair tomorrow   LOS: 1 day   Estefana Keas, DO [facility] Gastroenterology

## 2024-06-14 NOTE — Plan of Care (Signed)
   Problem: Education: Goal: Knowledge of General Education information will improve Description: Including pain rating scale, medication(s)/side effects and non-pharmacologic comfort measures Outcome: Completed/Met

## 2024-06-14 NOTE — Progress Notes (Signed)
 PROGRESS NOTE  Jeremy Randall  FMW:[id number] DOB: June 03, 1971 DOA: 06/13/2024 PCP: Celestia Rosaline SQUIBB, NP   Brief Narrative: Patient is a 53 year old male with history of asthma, tobacco use, history of dental abscess who presented with complaint of nausea, vomiting, abdominal pain.  Abdominal pain was mainly in the epigastric region.  Symptoms ongoing for 2 weeks.  CT abdomen/pelvis showed thickening of his distal stomach and duodenum, possibility of contained perforated peptic ulcer.  General surgery consulted.  Currently clinically improving.  Plan to continue conservative management without surgical intervention.  GI also consulted.  Assessment & Plan:  Principal Problem:   Perforated ulcer (HCC) Active Problems:   Atrial fibrillation (HCC)   Gastritis   Contained perforated peptic ulcer: General surgery consulted.  Currently on clear liquid diet.  Continue pain management, supportive care.  On IV Protonix .  Also on IV antibiotics.  No plan for surgical intervention.  May need EGD show GI consulted.  May need to check for H. pylori as well. Currently n.p.o.  History of asthma: Currently not in exacerbation  Tobacco use: On nicotine patch  Elevated blood pressure: Blood pressure fluctuating.  Not taking any antihypertensives at home.  Will continue to monitor his blood pressure and may put on scheduled antihypertensive if he needs        DVT prophylaxis:SCDs Start: 06/13/24 1952     Code Status: Full Code  Family Communication: None at the bedside  Patient status: Inpatient  Patient is from : Home  Anticipated discharge to: Home Home  Estimated DC date: 2 to 3 days   Consultants: GI, general surgery  Procedures: None none  Antimicrobials:  Anti-infectives (From admission, onward)    Start     Dose/Rate Route Frequency Ordered Stop   06/13/24 2200  piperacillin-tazobactam (ZOSYN) IVPB 3.375 g       Placed in Followed by Linked Group   3.375 g 12.5 mL/hr over  240 Minutes Intravenous Every 8 hours 06/13/24 1611     06/13/24 1615  piperacillin-tazobactam (ZOSYN) IVPB 3.375 g       Placed in Followed by Linked Group   3.375 g 100 mL/hr over 30 Minutes Intravenous  Once 06/13/24 1611 06/13/24 1734       Subjective: Patient seen and examined the bedside today.  Comfortable.  Denies any abdomen pain, nausea or vomiting.  No new complaints  Objective: Vitals:   06/13/24 1806 06/13/24 1930 06/14/24 0439 06/14/24 0755  BP: (!) 143/85 (!) 161/86 135/87 (!) 157/90  Pulse: (!) 56 74 67 (!) 55  Resp: 19 20 17 19   Temp: 98.4 F (36.9 C) 97.9 F (36.6 C) 98.2 F (36.8 C) 98.4 F (36.9 C)  TempSrc:  Oral Oral   SpO2: 95% 100% 100% 100%  Weight:      Height:        Intake/Output Summary (Last 24 hours) at 06/14/2024 0834 Last data filed at 06/14/2024 0600 Gross per 24 hour  Intake 1327.54 ml  Output 0 ml  Net 1327.54 ml   Filed Weights   06/13/24 1050  Weight: 89.4 kg    Examination:  General exam: Overall comfortable, not in distress HEENT: PERRL Respiratory system:  no wheezes or crackles  Cardiovascular system: S1 & S2 heard, RRR.  Gastrointestinal system: Abdomen is nondistended, soft and nontender. Central nervous system: Alert and oriented Extremities: No edema, no clubbing ,no cyanosis Skin: No rashes, no ulcers,no icterus     Data Reviewed: I have personally reviewed following labs and imaging  studies  CBC: Recent Labs  Lab 06/13/24 1121 06/14/24 0306  WBC 6.6 5.9  NEUTROABS 3.7  --   HGB 15.0 13.8  HCT 43.5 39.7  MCV 86.3 85.0  PLT 243 236   Basic Metabolic Panel: Recent Labs  Lab 06/13/24 1121 06/14/24 0306  NA 136 133*  K 3.8 3.7  CL 99 102  CO2 26 23  GLUCOSE 99 111*  BUN 7 7  CREATININE 0.95 0.99  CALCIUM 9.9 8.7*     No results found for this or any previous visit (from the past 240 hours).   Radiology Studies: CT ABDOMEN PELVIS W CONTRAST Result Date: 06/13/2024 CLINICAL DATA:  Provided  history: Pancreatitis, acute, severe Radiologic records indicate right upper quadrant pain. EXAM: CT ABDOMEN AND PELVIS WITH CONTRAST TECHNIQUE: Multidetector CT imaging of the abdomen and pelvis was performed using the standard protocol following bolus administration of intravenous contrast. RADIATION DOSE REDUCTION: This exam was performed according to the departmental dose-optimization program which includes automated exposure control, adjustment of the mA and/or kV according to patient size and/or use of iterative reconstruction technique. CONTRAST:  OMNIPAQUE  IOHEXOL  300 MG/ML  SOLN COMPARISON:  Ultrasound earlier today.  CT 09/08/2023 FINDINGS: Lower chest: Subsegmental atelectasis in the right lower lobe. No pleural effusion. Hepatobiliary: Scattered tiny hepatic hypodensities are too small to characterize. No suspicious liver lesion. Gallbladder physiologically distended, no calcified stone. No biliary dilatation. Pancreas: Homogeneous attenuation and enhancement. No peripancreatic fat stranding or inflammation. No evidence of pancreatic mass or focal fluid collection. Pancreatic duct is upper normal at 3 mm. Spleen: Normal in size without focal abnormality. Adrenals/Urinary Tract: 2.9 cm left adrenal nodule is unchanged from prior exam. The right adrenal gland is normal. No hydronephrosis or perinephric edema. Homogeneous renal enhancement with symmetric excretion on delayed phase imaging. Urinary bladder is partially distended. No bladder wall thickening for degree of distension. Stomach/Bowel: Prominent wall thickening about the distal stomach with adjacent fat stranding, for example series 2, image 35. Outpouching containing fluid and air, series 2, image 32 and sagittal series 5, image 62 may represent is suspicious for ulcer and contained perforation. Mild wall hyperemia. No free air. No small bowel distension or obstruction. Moderate volume of stool in the right colon. The appendix is not  visualized, history of appendectomy per electronic records. Vascular/Lymphatic: Aortic atherosclerosis. No aneurysm. The portal and splenic veins are patent. Few prominent and enlarged upper abdominal lymph nodes. This includes 11 mm perigastric node series 2, image 38 and peripancreatic/perigastric node measuring 8 mm, series 2, image 27. Reproductive: Prostate is unremarkable. Other: No free air.  No free fluid.  No abdominal wall hernia. Musculoskeletal: Mild L4-L5 degenerative disc disease. There are no acute or suspicious osseous abnormalities. IMPRESSION: 1. Prominent wall thickening about the distal stomach with adjacent fat stranding. Outpouching containing fluid and air may represent ulcer and contained perforation. No free air. Recommend GI and consider surgical consultation. 2. Few prominent and enlarged upper abdominal lymph nodes are likely reactive. 3. No CT findings of acute pancreatitis. 4. Stable 2.9 cm left adrenal nodule, likely adenoma. Aortic Atherosclerosis (ICD10-I70.0). Electronically Signed   By: Andrea Gasman M.D.   On: 06/13/2024 15:47   US  Abdomen Limited RUQ (LIVER/GB) Result Date: 06/13/2024 CLINICAL DATA:  151471 RUQ pain 151471 EXAM: ULTRASOUND ABDOMEN LIMITED RIGHT UPPER QUADRANT COMPARISON:  September 08, 2023 FINDINGS: Gallbladder: Small volume biliary sludge. No gallstones. No wall thickening or pericholecystic fluid. No sonographic Murphy's sign noted by sonographer. Common bile duct:  Diameter: 2 mm Liver: Normal echogenicity. No focal lesion identified. No intrahepatic biliary ductal dilation. Portal vein is patent on color Doppler imaging with normal direction of blood flow towards the liver. Other: None. IMPRESSION: Small volume layering biliary sludge. No cholecystolithiasis or changes of acute cholecystitis. Electronically Signed   By: Rogelia Myers M.D.   On: 06/13/2024 14:17    Scheduled Meds:  pantoprazole  (PROTONIX ) IV  40 mg Intravenous Q12H   Continuous  Infusions:  dextrose 5% lactated ringers 125 mL/hr at 06/14/24 0327   piperacillin-tazobactam (ZOSYN)  IV 3.375 g (06/14/24 0558)     LOS: 1 day   Ivonne Mustache, MD [facility] P10/01/2024, 8:34 AM

## 2024-06-14 NOTE — Progress Notes (Addendum)
 Subjective/Chief Complaint: Complains of soreness   Objective: Vital signs in last 24 hours: Temp:  [97.9 F (36.6 C)-98.4 F (36.9 C)] 98.4 F (36.9 C) (10/05 0755) Pulse Rate:  [49-74] 55 (10/05 0755) Resp:  [12-20] 19 (10/05 0755) BP: (135-182)/(85-105) 157/90 (10/05 0755) SpO2:  [95 %-100 %] 100 % (10/05 0755) Weight:  [89.4 kg] 89.4 kg (10/04 1050) Last BM Date : 06/10/24  Intake/Output from previous day: 10/04 0701 - 10/05 0700 In: 1327.5 [I.V.:1227.2; IV Piggyback:100.3] Out: 0  Intake/Output this shift: No intake/output data recorded.  General appearance: alert and cooperative Resp: clear to auscultation bilaterally Cardio: regular rate and rhythm GI: soft, mild epigastric tenderness  Lab Results:  Recent Labs    06/13/24 1121 06/14/24 0306  WBC 6.6 5.9  HGB 15.0 13.8  HCT 43.5 39.7  PLT 243 236   BMET Recent Labs    06/13/24 1121 06/14/24 0306  NA 136 133*  K 3.8 3.7  CL 99 102  CO2 26 23  GLUCOSE 99 111*  BUN 7 7  CREATININE 0.95 0.99  CALCIUM 9.9 8.7*   PT/INR No results for input(s): LABPROT, INR in the last 72 hours. ABG No results for input(s): PHART, HCO3 in the last 72 hours.  Invalid input(s): PCO2, PO2  Studies/Results: CT ABDOMEN PELVIS W CONTRAST Result Date: 06/13/2024 CLINICAL DATA:  Provided history: Pancreatitis, acute, severe Radiologic records indicate right upper quadrant pain. EXAM: CT ABDOMEN AND PELVIS WITH CONTRAST TECHNIQUE: Multidetector CT imaging of the abdomen and pelvis was performed using the standard protocol following bolus administration of intravenous contrast. RADIATION DOSE REDUCTION: This exam was performed according to the departmental dose-optimization program which includes automated exposure control, adjustment of the mA and/or kV according to patient size and/or use of iterative reconstruction technique. CONTRAST:  OMNIPAQUE  IOHEXOL  300 MG/ML  SOLN COMPARISON:  Ultrasound earlier  today.  CT 09/08/2023 FINDINGS: Lower chest: Subsegmental atelectasis in the right lower lobe. No pleural effusion. Hepatobiliary: Scattered tiny hepatic hypodensities are too small to characterize. No suspicious liver lesion. Gallbladder physiologically distended, no calcified stone. No biliary dilatation. Pancreas: Homogeneous attenuation and enhancement. No peripancreatic fat stranding or inflammation. No evidence of pancreatic mass or focal fluid collection. Pancreatic duct is upper normal at 3 mm. Spleen: Normal in size without focal abnormality. Adrenals/Urinary Tract: 2.9 cm left adrenal nodule is unchanged from prior exam. The right adrenal gland is normal. No hydronephrosis or perinephric edema. Homogeneous renal enhancement with symmetric excretion on delayed phase imaging. Urinary bladder is partially distended. No bladder wall thickening for degree of distension. Stomach/Bowel: Prominent wall thickening about the distal stomach with adjacent fat stranding, for example series 2, image 35. Outpouching containing fluid and air, series 2, image 32 and sagittal series 5, image 62 may represent is suspicious for ulcer and contained perforation. Mild wall hyperemia. No free air. No small bowel distension or obstruction. Moderate volume of stool in the right colon. The appendix is not visualized, history of appendectomy per electronic records. Vascular/Lymphatic: Aortic atherosclerosis. No aneurysm. The portal and splenic veins are patent. Few prominent and enlarged upper abdominal lymph nodes. This includes 11 mm perigastric node series 2, image 38 and peripancreatic/perigastric node measuring 8 mm, series 2, image 27. Reproductive: Prostate is unremarkable. Other: No free air.  No free fluid.  No abdominal wall hernia. Musculoskeletal: Mild L4-L5 degenerative disc disease. There are no acute or suspicious osseous abnormalities. IMPRESSION: 1. Prominent wall thickening about the distal stomach with adjacent fat  stranding.  Outpouching containing fluid and air may represent ulcer and contained perforation. No free air. Recommend GI and consider surgical consultation. 2. Few prominent and enlarged upper abdominal lymph nodes are likely reactive. 3. No CT findings of acute pancreatitis. 4. Stable 2.9 cm left adrenal nodule, likely adenoma. Aortic Atherosclerosis (ICD10-I70.0). Electronically Signed   By: Andrea Gasman M.D.   On: 06/13/2024 15:47   US  Abdomen Limited RUQ (LIVER/GB) Result Date: 06/13/2024 CLINICAL DATA:  151471 RUQ pain 151471 EXAM: ULTRASOUND ABDOMEN LIMITED RIGHT UPPER QUADRANT COMPARISON:  September 08, 2023 FINDINGS: Gallbladder: Small volume biliary sludge. No gallstones. No wall thickening or pericholecystic fluid. No sonographic Murphy's sign noted by sonographer. Common bile duct: Diameter: 2 mm Liver: Normal echogenicity. No focal lesion identified. No intrahepatic biliary ductal dilation. Portal vein is patent on color Doppler imaging with normal direction of blood flow towards the liver. Other: None. IMPRESSION: Small volume layering biliary sludge. No cholecystolithiasis or changes of acute cholecystitis. Electronically Signed   By: Rogelia Myers M.D.   On: 06/13/2024 14:17    Anti-infectives: Anti-infectives (From admission, onward)    Start     Dose/Rate Route Frequency Ordered Stop   06/13/24 2200  piperacillin-tazobactam (ZOSYN) IVPB 3.375 g       Placed in Followed by Linked Group   3.375 g 12.5 mL/hr over 240 Minutes Intravenous Every 8 hours 06/13/24 1611     06/13/24 1615  piperacillin-tazobactam (ZOSYN) IVPB 3.375 g       Placed in Followed by Linked Group   3.375 g 100 mL/hr over 30 Minutes Intravenous  Once 06/13/24 1611 06/13/24 1734       Assessment/Plan: s/p * No surgery found * Perforated DU Npo PPI Continue zosyn Will likely need UGI in a couple days May also need to check for H. Pylori  LOS: 1 day    Deward Null III 06/14/2024

## 2024-06-15 ENCOUNTER — Inpatient Hospital Stay (HOSPITAL_COMMUNITY): Payer: MEDICAID

## 2024-06-15 MED ORDER — IOHEXOL 300 MG/ML  SOLN
100.0000 mL | Freq: Once | INTRAMUSCULAR | Status: AC | PRN
  Administered 2024-06-15: 200 mL via ORAL

## 2024-06-15 MED ORDER — SODIUM CHLORIDE (PF) 0.9 % IJ SOLN
INTRAMUSCULAR | Status: AC
  Administered 2024-06-15: 10 mL
  Filled 2024-06-15: qty 10

## 2024-06-15 MED ORDER — BOOST / RESOURCE BREEZE PO LIQD CUSTOM
1.0000 | Freq: Three times a day (TID) | ORAL | Status: DC
  Administered 2024-06-16: 1 via ORAL

## 2024-06-15 NOTE — Plan of Care (Signed)
   Problem: Health Behavior/Discharge Planning: Goal: Ability to manage health-related needs will improve Outcome: Completed/Met

## 2024-06-15 NOTE — Progress Notes (Signed)
 Subjective/Chief Complaint: Better ab pain   Objective: Vital signs in last 24 hours: Temp:  [98.1 F (36.7 C)-98.9 F (37.2 C)] 98.1 F (36.7 C) (10/06 0818) Pulse Rate:  [50-55] 50 (10/06 0818) Resp:  [18-20] 18 (10/06 0818) BP: (133-155)/(78-92) 155/90 (10/06 0818) SpO2:  [100 %] 100 % (10/06 0818) Last BM Date : 06/10/24  Intake/Output from previous day: 10/05 0701 - 10/06 0700 In: 2686.9 [I.V.:2525.1; IV Piggyback:161.8] Out: 0  Intake/Output this shift: No intake/output data recorded.  Ab soft really not very tender at all nondistended  Lab Results:  Recent Labs    06/13/24 1121 06/14/24 0306  WBC 6.6 5.9  HGB 15.0 13.8  HCT 43.5 39.7  PLT 243 236   BMET Recent Labs    06/13/24 1121 06/14/24 0306  NA 136 133*  K 3.8 3.7  CL 99 102  CO2 26 23  GLUCOSE 99 111*  BUN 7 7  CREATININE 0.95 0.99  CALCIUM 9.9 8.7*   PT/INR No results for input(s): LABPROT, INR in the last 72 hours. ABG No results for input(s): PHART, HCO3 in the last 72 hours.  Invalid input(s): PCO2, PO2  Studies/Results: CT ABDOMEN PELVIS W CONTRAST Result Date: 06/13/2024 CLINICAL DATA:  Provided history: Pancreatitis, acute, severe Radiologic records indicate right upper quadrant pain. EXAM: CT ABDOMEN AND PELVIS WITH CONTRAST TECHNIQUE: Multidetector CT imaging of the abdomen and pelvis was performed using the standard protocol following bolus administration of intravenous contrast. RADIATION DOSE REDUCTION: This exam was performed according to the departmental dose-optimization program which includes automated exposure control, adjustment of the mA and/or kV according to patient size and/or use of iterative reconstruction technique. CONTRAST:  OMNIPAQUE  IOHEXOL  300 MG/ML  SOLN COMPARISON:  Ultrasound earlier today.  CT 09/08/2023 FINDINGS: Lower chest: Subsegmental atelectasis in the right lower lobe. No pleural effusion. Hepatobiliary: Scattered tiny hepatic  hypodensities are too small to characterize. No suspicious liver lesion. Gallbladder physiologically distended, no calcified stone. No biliary dilatation. Pancreas: Homogeneous attenuation and enhancement. No peripancreatic fat stranding or inflammation. No evidence of pancreatic mass or focal fluid collection. Pancreatic duct is upper normal at 3 mm. Spleen: Normal in size without focal abnormality. Adrenals/Urinary Tract: 2.9 cm left adrenal nodule is unchanged from prior exam. The right adrenal gland is normal. No hydronephrosis or perinephric edema. Homogeneous renal enhancement with symmetric excretion on delayed phase imaging. Urinary bladder is partially distended. No bladder wall thickening for degree of distension. Stomach/Bowel: Prominent wall thickening about the distal stomach with adjacent fat stranding, for example series 2, image 35. Outpouching containing fluid and air, series 2, image 32 and sagittal series 5, image 62 may represent is suspicious for ulcer and contained perforation. Mild wall hyperemia. No free air. No small bowel distension or obstruction. Moderate volume of stool in the right colon. The appendix is not visualized, history of appendectomy per electronic records. Vascular/Lymphatic: Aortic atherosclerosis. No aneurysm. The portal and splenic veins are patent. Few prominent and enlarged upper abdominal lymph nodes. This includes 11 mm perigastric node series 2, image 38 and peripancreatic/perigastric node measuring 8 mm, series 2, image 27. Reproductive: Prostate is unremarkable. Other: No free air.  No free fluid.  No abdominal wall hernia. Musculoskeletal: Mild L4-L5 degenerative disc disease. There are no acute or suspicious osseous abnormalities. IMPRESSION: 1. Prominent wall thickening about the distal stomach with adjacent fat stranding. Outpouching containing fluid and air may represent ulcer and contained perforation. No free air. Recommend GI and consider surgical  consultation.  2. Few prominent and enlarged upper abdominal lymph nodes are likely reactive. 3. No CT findings of acute pancreatitis. 4. Stable 2.9 cm left adrenal nodule, likely adenoma. Aortic Atherosclerosis (ICD10-I70.0). Electronically Signed   By: Andrea Gasman M.D.   On: 06/13/2024 15:47   US  Abdomen Limited RUQ (LIVER/GB) Result Date: 06/13/2024 CLINICAL DATA:  151471 RUQ pain 151471 EXAM: ULTRASOUND ABDOMEN LIMITED RIGHT UPPER QUADRANT COMPARISON:  September 08, 2023 FINDINGS: Gallbladder: Small volume biliary sludge. No gallstones. No wall thickening or pericholecystic fluid. No sonographic Murphy's sign noted by sonographer. Common bile duct: Diameter: 2 mm Liver: Normal echogenicity. No focal lesion identified. No intrahepatic biliary ductal dilation. Portal vein is patent on color Doppler imaging with normal direction of blood flow towards the liver. Other: None. IMPRESSION: Small volume layering biliary sludge. No cholecystolithiasis or changes of acute cholecystitis. Electronically Signed   By: Rogelia Myers M.D.   On: 06/13/2024 14:17    Anti-infectives: Anti-infectives (From admission, onward)    Start     Dose/Rate Route Frequency Ordered Stop   06/13/24 2200  piperacillin-tazobactam (ZOSYN) IVPB 3.375 g       Placed in Followed by Linked Group   3.375 g 12.5 mL/hr over 240 Minutes Intravenous Every 8 hours 06/13/24 1611     06/13/24 1615  piperacillin-tazobactam (ZOSYN) IVPB 3.375 g       Placed in Followed by Linked Group   3.375 g 100 mL/hr over 30 Minutes Intravenous  Once 06/13/24 1611 06/13/24 1734       Assessment/Plan: Contained DU perforation -does not appear to need surgery -UGI this am, if fine can advance diet -agree with protonix  and f/u egd -will sign off if UGI fine   Jeremy Randall 06/15/2024

## 2024-06-15 NOTE — Plan of Care (Signed)
 Tolerating clear liquids. 2 BM. Still requiring IV pain meds but abd pain less than preadmission.   Problem: Health Behavior/Discharge Planning: Goal: Ability to manage health-related needs will improve Outcome: Progressing   Problem: Clinical Measurements: Goal: Ability to maintain clinical measurements within normal limits will improve Outcome: Progressing Goal: Will remain free from infection Outcome: Progressing Goal: Diagnostic test results will improve Outcome: Progressing Goal: Respiratory complications will improve Outcome: Progressing Goal: Cardiovascular complication will be avoided Outcome: Progressing   Problem: Activity: Goal: Risk for activity intolerance will decrease Outcome: Progressing   Problem: Nutrition: Goal: Adequate nutrition will be maintained Outcome: Progressing   Problem: Coping: Goal: Level of anxiety will decrease Outcome: Progressing   Problem: Elimination: Goal: Will not experience complications related to bowel motility Outcome: Progressing Goal: Will not experience complications related to urinary retention Outcome: Progressing   Problem: Pain Managment: Goal: General experience of comfort will improve and/or be controlled Outcome: Progressing   Problem: Safety: Goal: Ability to remain free from injury will improve Outcome: Progressing

## 2024-06-15 NOTE — Progress Notes (Signed)
 Subjective: Complains of continued epigastric abdominal discomfort.  Last bowel movement was at least 4 days ago.  Objective: Vital signs in last 24 hours: Temp:  [98.1 F (36.7 C)-98.9 F (37.2 C)] 98.1 F (36.7 C) (10/06 0818) Pulse Rate:  [50-55] 50 (10/06 0818) Resp:  [18-20] 18 (10/06 0818) BP: (133-155)/(78-92) 155/90 (10/06 0818) SpO2:  [100 %] 100 % (10/06 0818) Weight change:  Last BM Date : 06/10/24  PE: Nonicteric, no pallor GENERAL: Not in distress  ABDOMEN: Mild epigastric and left upper quadrant tenderness, otherwise bowel sounds normal active, soft abdomen EXTREMITIES: No deformity  Lab Results: Results for orders placed or performed during the hospital encounter of 06/13/24 (from the past 48 hours)  Comprehensive metabolic panel     Status: Abnormal   Collection Time: 06/13/24 11:21 AM  Result Value Ref Range   Sodium 136 135 - 145 mmol/L   Potassium 3.8 3.5 - 5.1 mmol/L   Chloride 99 98 - 111 mmol/L   CO2 26 22 - 32 mmol/L   Glucose, Bld 99 70 - 99 mg/dL    Comment: Glucose reference range applies only to samples taken after fasting for at least 8 hours.   BUN 7 6 - 20 mg/dL   Creatinine, Ser 9.04 0.61 - 1.24 mg/dL   Calcium 9.9 8.9 - 89.6 mg/dL   Total Protein 7.8 6.5 - 8.1 g/dL   Albumin 4.3 3.5 - 5.0 g/dL   AST 14 (L) 15 - 41 U/L   ALT 8 0 - 44 U/L   Alkaline Phosphatase 80 38 - 126 U/L   Total Bilirubin 0.6 0.0 - 1.2 mg/dL   GFR, Estimated >39 >39 mL/min    Comment: (NOTE) Calculated using the CKD-EPI Creatinine Equation (2021)    Anion gap 11 5 - 15    Comment: Performed at [facility], [address] [place], [place] [address]  Lipase, blood     Status: None   Collection Time: 06/13/24 11:21 AM  Result Value Ref Range   Lipase 30 11 - 51 U/L    Comment: Performed at [facility], [address], [place], [place] [address]  CBC with Diff     Status: None   Collection Time: 06/13/24 11:21 AM  Result  Value Ref Range   WBC 6.6 4.0 - 10.5 K/uL   RBC 5.04 4.22 - 5.81 MIL/uL   Hemoglobin 15.0 13.0 - 17.0 g/dL   HCT 56.4 60.9 - 47.9 %   MCV 86.3 80.0 - 100.0 fL   MCH 29.8 26.0 - 34.0 pg   MCHC 34.5 30.0 - 36.0 g/dL   RDW 86.4 88.4 - 84.4 %   Platelets 243 150 - 400 K/uL   nRBC 0.0 0.0 - 0.2 %   Neutrophils Relative % 55 %   Neutro Abs 3.7 1.7 - 7.7 K/uL   Lymphocytes Relative 28 %   Lymphs Abs 1.9 0.7 - 4.0 K/uL   Monocytes Relative 13 %   Monocytes Absolute 0.8 0.1 - 1.0 K/uL   Eosinophils Relative 2 %   Eosinophils Absolute 0.2 0.0 - 0.5 K/uL   Basophils Relative 1 %   Basophils Absolute 0.0 0.0 - 0.1 K/uL   Immature Granulocytes 1 %   Abs Immature Granulocytes 0.03 0.00 - 0.07 K/uL    Comment: Performed at [facility], [address] [place], [place] [address]  Troponin T, High Sensitivity     Status: None   Collection Time: 06/13/24 11:35 AM  Result Value Ref Range   Troponin T High Sensitivity <15 0 - 19 ng/L    Comment: (NOTE) Biotin concentrations > 1000 ng/mL falsely decrease TnT results.  Serial cardiac troponin measurements are suggested.  Refer to the Links section for chest pain algorithms and additional  guidance. Performed at [facility], [address], [place], [place] [address]   Urinalysis, Routine w reflex microscopic -Urine, Clean Catch     Status: Abnormal   Collection Time: 06/13/24  2:23 PM  Result Value Ref Range   Color, Urine YELLOW YELLOW   APPearance CLEAR CLEAR   Specific Gravity, Urine 1.015 1.005 - 1.030   pH 7.0 5.0 - 8.0   Glucose, UA NEGATIVE NEGATIVE mg/dL   Hgb urine dipstick NEGATIVE NEGATIVE   Bilirubin Urine NEGATIVE NEGATIVE   Ketones, ur 15 (A) NEGATIVE mg/dL   Protein, ur NEGATIVE NEGATIVE mg/dL   Nitrite NEGATIVE NEGATIVE   Leukocytes,Ua NEGATIVE NEGATIVE    Comment: Performed at [facility], [address], [place], [place] [address]  Troponin T, High  Sensitivity     Status: None   Collection Time: 06/13/24  2:23 PM  Result Value Ref Range   Troponin T High Sensitivity <15 0 - 19 ng/L    Comment: (NOTE) Biotin concentrations > 1000 ng/mL falsely decrease TnT results.  Serial cardiac troponin measurements are suggested.  Refer to the Links section for chest pain algorithms and additional  guidance. Performed at [facility], [address], [place], [place] [address]   CBC     Status: None   Collection Time: 06/14/24  3:06 AM  Result Value Ref Range   WBC 5.9 4.0 - 10.5 K/uL   RBC 4.67 4.22 - 5.81 MIL/uL   Hemoglobin 13.8 13.0 - 17.0 g/dL   HCT 60.2 60.9 - 47.9 %   MCV 85.0 80.0 - 100.0 fL   MCH 29.6 26.0 - 34.0 pg   MCHC 34.8 30.0 - 36.0 g/dL   RDW 86.7 88.4 - 84.4 %   Platelets 236 150 - 400 K/uL   nRBC 0.0 0.0 - 0.2 %    Comment: Performed at [facility], [address], [place], [place] [address]  Comprehensive metabolic panel     Status: Abnormal   Collection Time: 06/14/24  3:06 AM  Result Value Ref Range   Sodium 133 (L) 135 - 145 mmol/L   Potassium 3.7 3.5 - 5.1 mmol/L   Chloride 102 98 - 111 mmol/L   CO2 23 22 - 32 mmol/L   Glucose, Bld 111 (H) 70 - 99 mg/dL    Comment: Glucose reference range applies only to samples taken after fasting for at least 8 hours.   BUN 7 6 - 20 mg/dL   Creatinine, Ser 9.00 0.61 - 1.24 mg/dL   Calcium 8.7 (L) 8.9 - 10.3 mg/dL   Total Protein 6.4 (L) 6.5 - 8.1 g/dL   Albumin 3.2 (L) 3.5 - 5.0 g/dL   AST 11 (L) 15 - 41 U/L   ALT 6 0 - 44 U/L   Alkaline Phosphatase 54 38 - 126 U/L   Total Bilirubin 0.7 0.0 - 1.2 mg/dL   GFR, Estimated >39 >39 mL/min    Comment: (NOTE) Calculated using the CKD-EPI Creatinine Equation (2021)    Anion gap 8 5 - 15    Comment: Performed at [facility], [address] [place], [place] [address]  HIV Antibody (routine testing w rflx)  Status: None   Collection Time: 06/14/24  3:06 AM  Result Value Ref Range   HIV  Screen 4th Generation wRfx Non Reactive Non Reactive    Comment: Performed at [facility], [address], [place], [place] [address]    Studies/Results: CT ABDOMEN PELVIS W CONTRAST Result Date: 06/13/2024 CLINICAL DATA:  Provided history: Pancreatitis, acute, severe Radiologic records indicate right upper quadrant pain. EXAM: CT ABDOMEN AND PELVIS WITH CONTRAST TECHNIQUE: Multidetector CT imaging of the abdomen and pelvis was performed using the standard protocol following bolus administration of intravenous contrast. RADIATION DOSE REDUCTION: This exam was performed according to the departmental dose-optimization program which includes automated exposure control, adjustment of the mA and/or kV according to patient size and/or use of iterative reconstruction technique. CONTRAST:  OMNIPAQUE  IOHEXOL  300 MG/ML  SOLN COMPARISON:  Ultrasound earlier today.  CT 09/08/2023 FINDINGS: Lower chest: Subsegmental atelectasis in the right lower lobe. No pleural effusion. Hepatobiliary: Scattered tiny hepatic hypodensities are too small to characterize. No suspicious liver lesion. Gallbladder physiologically distended, no calcified stone. No biliary dilatation. Pancreas: Homogeneous attenuation and enhancement. No peripancreatic fat stranding or inflammation. No evidence of pancreatic mass or focal fluid collection. Pancreatic duct is upper normal at 3 mm. Spleen: Normal in size without focal abnormality. Adrenals/Urinary Tract: 2.9 cm left adrenal nodule is unchanged from prior exam. The right adrenal gland is normal. No hydronephrosis or perinephric edema. Homogeneous renal enhancement with symmetric excretion on delayed phase imaging. Urinary bladder is partially distended. No bladder wall thickening for degree of distension. Stomach/Bowel: Prominent wall thickening about the distal stomach with adjacent fat stranding, for example series 2, image 35. Outpouching containing fluid and air, series 2, image 32  and sagittal series 5, image 62 may represent is suspicious for ulcer and contained perforation. Mild wall hyperemia. No free air. No small bowel distension or obstruction. Moderate volume of stool in the right colon. The appendix is not visualized, history of appendectomy per electronic records. Vascular/Lymphatic: Aortic atherosclerosis. No aneurysm. The portal and splenic veins are patent. Few prominent and enlarged upper abdominal lymph nodes. This includes 11 mm perigastric node series 2, image 38 and peripancreatic/perigastric node measuring 8 mm, series 2, image 27. Reproductive: Prostate is unremarkable. Other: No free air.  No free fluid.  No abdominal wall hernia. Musculoskeletal: Mild L4-L5 degenerative disc disease. There are no acute or suspicious osseous abnormalities. IMPRESSION: 1. Prominent wall thickening about the distal stomach with adjacent fat stranding. Outpouching containing fluid and air may represent ulcer and contained perforation. No free air. Recommend GI and consider surgical consultation. 2. Few prominent and enlarged upper abdominal lymph nodes are likely reactive. 3. No CT findings of acute pancreatitis. 4. Stable 2.9 cm left adrenal nodule, likely adenoma. Aortic Atherosclerosis (ICD10-I70.0). Electronically Signed   By: Andrea Gasman M.D.   On: 06/13/2024 15:47   US  Abdomen Limited RUQ (LIVER/GB) Result Date: 06/13/2024 CLINICAL DATA:  151471 RUQ pain 151471 EXAM: ULTRASOUND ABDOMEN LIMITED RIGHT UPPER QUADRANT COMPARISON:  September 08, 2023 FINDINGS: Gallbladder: Small volume biliary sludge. No gallstones. No wall thickening or pericholecystic fluid. No sonographic Murphy's sign noted by sonographer. Common bile duct: Diameter: 2 mm Liver: Normal echogenicity. No focal lesion identified. No intrahepatic biliary ductal dilation. Portal vein is patent on color Doppler imaging with normal direction of blood flow towards the liver. Other: None. IMPRESSION: Small volume layering  biliary sludge. No cholecystolithiasis or changes of acute cholecystitis. Electronically Signed   By: Rogelia Carlean HERO.D.  On: 06/13/2024 14:17    Medications: I have reviewed the patient's current medications.  Assessment: Prominent wall thickening of distal stomach with adjacent fat stranding, outpouching containing fluid and air may represent ulcer and contained perforation  Prominent and enlarged upper abdominal lymph nodes  Stable hemoglobin 15/13.8, normal MCV and platelet Normal BUN/creatinine ratio 7/0.99 Mild malnutrition, albumin 3.2, total protein 6.4   Incidental finding: 2.9 cm left adrenal adenoma Ultrasound: Small volume layering biliary sludge, no cholecystolithiasis or changes of acute cholecystitis, CBD 2 mm   Plan: I have ordered upper GI series with Gastrografin for further evaluation. If there is no evidence of perforation, will start patient on clear liquid diet.  H. pylori stool antigen has been ordered.  Continue Protonix  40 mg every 12 hours. Currently on D5 lactated Ringer's at 125 mL/h. Eventually will need an EGD for further evaluation, likely in the next several weeks as an outpatient. Will also need outpatient screening colonoscopy at the same time. Discussed the same with the patient as well as his fiance in details at bedside.  Estelita Manas, MD 06/15/2024, 8:49 AM

## 2024-06-15 NOTE — TOC CM/SW Note (Signed)
 Transition of Care [facility]) - Inpatient Brief Assessment   Patient Details  Name: Jeremy Randall MRN: [id number] Date of Birth: 07-15-1971  Transition of Care [facility]) CM/SW Contact:    Tom-Johnson, Harvest Muskrat, RN Phone Number: 06/15/2024, 5:00 PM   Clinical Narrative:   Patient presented to the ED with Abdominal pain. Patient has hx of Asthma, Gastritis, A-Fib, Tobacco abuse.  Found to have Perforated Ulcer. Gen Sx consulted, no emergent surgical intervention at this time. Plan is to continue PPI and IV abx. Possible Upper GI in the coming days for further evaluation.   CM spoke with patient and [name], sister [name] and daughter, [name] at bedside about needs for post hospital transition.  Patient lives with [name], has 10 children. Employed as a [employer], has two supportive siblings. Does not have DME's at home.  PCP is Celestia Rosaline SQUIBB, NP and uses [facility] on [address]. No Insurance is listed on Epic, patient states he has insurance with [employer].   Patient not Medically ready for discharge.  CM will continue to follow as patient progresses with care towards discharge.                   Transition of Care Asessment: Insurance and Status: Insurance coverage has been reviewed Patient has primary care physician: Yes Home environment has been reviewed: Yes Prior level of function:: Independent Prior/Current Home Services: No current home services Social Drivers of Health Review: SDOH reviewed no interventions necessary Readmission risk has been reviewed: Yes Transition of care needs: no transition of care needs at this time

## 2024-06-15 NOTE — Progress Notes (Signed)
 PROGRESS NOTE  Jeremy Randall  FMW:[id number] DOB: 03-12-1971 DOA: 06/13/2024 PCP: Celestia Rosaline SQUIBB, NP   Brief Narrative: Patient is a 53 year old male with history of asthma, tobacco use, history of dental abscess who presented with complaint of nausea, vomiting, abdominal pain.  Abdominal pain was mainly in the epigastric region.  Symptoms ongoing for 2 weeks.  CT abdomen/pelvis showed thickening of his distal stomach and duodenum, possibility of contained perforated peptic ulcer.  General surgery consulted.  Currently clinically improving.  Plan to continue conservative management without surgical intervention.  GI also consulted.  Upper GI series today showed wall thickening of the distal stomach ,no evidence of gastric or duodenal perforation.  Started on clear liquid diet  Assessment & Plan:  Principal Problem:   Perforated ulcer (HCC) Active Problems:   Atrial fibrillation (HCC)   Gastritis   Contained perforated peptic ulcer: General surgery consulted.   Continue pain management, supportive care.  On IV Protonix .  Also on IV antibiotics.  No plan for surgical intervention.  Pending stool H. pylori antigen . Upper GI series today showed wall thickening of the distal stomach ,no evidence of gastric or duodenal perforation.  Started on clear liquid diet.  GI recommending EGD in the next several weeks as outpatient.  May need outpatient screening colonoscopy at that time as well.  History of asthma: Currently not in exacerbation  Tobacco use: On nicotine patch  Elevated blood pressure: Blood pressure fluctuating.  Not taking any antihypertensives at home.  Will continue to monitor his blood pressure and may put on scheduled antihypertensive if he needs        DVT prophylaxis:SCDs Start: 06/13/24 1952     Code Status: Full Code  Family Communication: None at the bedside  Patient status: Inpatient  Patient is from : Home  Anticipated discharge to: Home   Estimated DC  date: after toleration of solid diet   Consultants: GI, general surgery  Procedures: None none  Antimicrobials:  Anti-infectives (From admission, onward)    Start     Dose/Rate Route Frequency Ordered Stop   06/13/24 2200  piperacillin-tazobactam (ZOSYN) IVPB 3.375 g       Placed in Followed by Linked Group   3.375 g 12.5 mL/hr over 240 Minutes Intravenous Every 8 hours 06/13/24 1611     06/13/24 1615  piperacillin-tazobactam (ZOSYN) IVPB 3.375 g       Placed in Followed by Linked Group   3.375 g 100 mL/hr over 30 Minutes Intravenous  Once 06/13/24 1611 06/13/24 1734       Subjective: Patient seen and examined at the bedside this afternoon.  Hemodynamically stable.  Comfortable.  No abdominal pain no nausea or vomiting.  Objective: Vitals:   06/14/24 1701 06/14/24 1934 06/15/24 0504 06/15/24 0818  BP: (!) 153/92 (!) 150/78 133/80 (!) 155/90  Pulse: (!) 52 (!) 55 (!) 50 (!) 50  Resp: 19 20 18 18   Temp: 98.7 F (37.1 C) 98.9 F (37.2 C) 98.2 F (36.8 C) 98.1 F (36.7 C)  TempSrc:  Oral Oral Oral  SpO2: 100% 100% 100% 100%  Weight:      Height:        Intake/Output Summary (Last 24 hours) at 06/15/2024 1154 Last data filed at 06/15/2024 1046 Gross per 24 hour  Intake 3304.71 ml  Output 0 ml  Net 3304.71 ml   Filed Weights   06/13/24 1050  Weight: 89.4 kg    Examination:  General exam: Overall comfortable, not in distress HEENT:  PERRL Respiratory system:  no wheezes or crackles  Cardiovascular system: S1 & S2 heard, RRR.  Gastrointestinal system: Abdomen is nondistended, soft and nontender. Central nervous system: Alert and oriented Extremities: No edema, no clubbing ,no cyanosis Skin: No rashes, no ulcers,no icterus     Data Reviewed: I have personally reviewed following labs and imaging studies  CBC: Recent Labs  Lab 06/13/24 1121 06/14/24 0306  WBC 6.6 5.9  NEUTROABS 3.7  --   HGB 15.0 13.8  HCT 43.5 39.7  MCV 86.3 85.0  PLT 243 236    Basic Metabolic Panel: Recent Labs  Lab 06/13/24 1121 06/14/24 0306  NA 136 133*  K 3.8 3.7  CL 99 102  CO2 26 23  GLUCOSE 99 111*  BUN 7 7  CREATININE 0.95 0.99  CALCIUM 9.9 8.7*     No results found for this or any previous visit (from the past 240 hours).   Radiology Studies: DG UGI W SINGLE CM (SOL OR THIN BA) Result Date: 06/15/2024 CLINICAL DATA:  Three weeks of epigastric pain progressively worsening. Nausea and vomiting x3 days. CT abdomen pelvis 06/13/2024 prominent wall thickening of the distal stomach with adjacent fat stranding, suspicious for ulcer versus contained perforation. Consult for water soluble upper GI study for further evaluation. EXAM: DG UGI W SINGLE CM TECHNIQUE: Single contrast examination was then performed using water soluble contrast (Omnipaque  300). This exam was performed by Kimble Clas, [name], and was supervised and interpreted by Dr. Dasie Hamburg. FLUOROSCOPY: Radiation Exposure Index (as provided by the fluoroscopic device): 63.20 mGy Kerma COMPARISON:  CT abdomen and pelvis 06/13/2024 FINDINGS: The examination was limited secondary to poor patient mobility. The patient was unable to stand or significantly roll on the fluoroscopy table. Esophagus:  Normal appearance. Esophageal motility:  Within normal limits. Gastroesophageal reflux:  None visualized. Ingested 13 mm barium tablet: Not given. Stomach: Small sliding hiatal hernia. Appearance of luminal narrowing in the distal stomach in the region of the pylorus corresponding to wall thickening on CT. No evidence of contrast extravasation. Gastric emptying: Normal. Duodenum: Normal appearance. No evidence of contrast extravasation. Other:  None. IMPRESSION: 1. Wall thickening of the distal stomach as shown on CT. No evidence of gastric or duodenal perforation. 2. Small sliding hiatal hernia. Electronically Signed   By: Dasie Hamburg M.D.   On: 06/15/2024 11:05   CT ABDOMEN PELVIS W CONTRAST Result Date:  06/13/2024 CLINICAL DATA:  Provided history: Pancreatitis, acute, severe Radiologic records indicate right upper quadrant pain. EXAM: CT ABDOMEN AND PELVIS WITH CONTRAST TECHNIQUE: Multidetector CT imaging of the abdomen and pelvis was performed using the standard protocol following bolus administration of intravenous contrast. RADIATION DOSE REDUCTION: This exam was performed according to the departmental dose-optimization program which includes automated exposure control, adjustment of the mA and/or kV according to patient size and/or use of iterative reconstruction technique. CONTRAST:  OMNIPAQUE  IOHEXOL  300 MG/ML  SOLN COMPARISON:  Ultrasound earlier today.  CT 09/08/2023 FINDINGS: Lower chest: Subsegmental atelectasis in the right lower lobe. No pleural effusion. Hepatobiliary: Scattered tiny hepatic hypodensities are too small to characterize. No suspicious liver lesion. Gallbladder physiologically distended, no calcified stone. No biliary dilatation. Pancreas: Homogeneous attenuation and enhancement. No peripancreatic fat stranding or inflammation. No evidence of pancreatic mass or focal fluid collection. Pancreatic duct is upper normal at 3 mm. Spleen: Normal in size without focal abnormality. Adrenals/Urinary Tract: 2.9 cm left adrenal nodule is unchanged from prior exam. The right adrenal gland is normal. No hydronephrosis  or perinephric edema. Homogeneous renal enhancement with symmetric excretion on delayed phase imaging. Urinary bladder is partially distended. No bladder wall thickening for degree of distension. Stomach/Bowel: Prominent wall thickening about the distal stomach with adjacent fat stranding, for example series 2, image 35. Outpouching containing fluid and air, series 2, image 32 and sagittal series 5, image 62 may represent is suspicious for ulcer and contained perforation. Mild wall hyperemia. No free air. No small bowel distension or obstruction. Moderate volume of stool in the  right colon. The appendix is not visualized, history of appendectomy per electronic records. Vascular/Lymphatic: Aortic atherosclerosis. No aneurysm. The portal and splenic veins are patent. Few prominent and enlarged upper abdominal lymph nodes. This includes 11 mm perigastric node series 2, image 38 and peripancreatic/perigastric node measuring 8 mm, series 2, image 27. Reproductive: Prostate is unremarkable. Other: No free air.  No free fluid.  No abdominal wall hernia. Musculoskeletal: Mild L4-L5 degenerative disc disease. There are no acute or suspicious osseous abnormalities. IMPRESSION: 1. Prominent wall thickening about the distal stomach with adjacent fat stranding. Outpouching containing fluid and air may represent ulcer and contained perforation. No free air. Recommend GI and consider surgical consultation. 2. Few prominent and enlarged upper abdominal lymph nodes are likely reactive. 3. No CT findings of acute pancreatitis. 4. Stable 2.9 cm left adrenal nodule, likely adenoma. Aortic Atherosclerosis (ICD10-I70.0). Electronically Signed   By: Andrea Gasman M.D.   On: 06/13/2024 15:47   US  Abdomen Limited RUQ (LIVER/GB) Result Date: 06/13/2024 CLINICAL DATA:  151471 RUQ pain 151471 EXAM: ULTRASOUND ABDOMEN LIMITED RIGHT UPPER QUADRANT COMPARISON:  September 08, 2023 FINDINGS: Gallbladder: Small volume biliary sludge. No gallstones. No wall thickening or pericholecystic fluid. No sonographic Murphy's sign noted by sonographer. Common bile duct: Diameter: 2 mm Liver: Normal echogenicity. No focal lesion identified. No intrahepatic biliary ductal dilation. Portal vein is patent on color Doppler imaging with normal direction of blood flow towards the liver. Other: None. IMPRESSION: Small volume layering biliary sludge. No cholecystolithiasis or changes of acute cholecystitis. Electronically Signed   By: Rogelia Myers M.D.   On: 06/13/2024 14:17    Scheduled Meds:  nicotine  14 mg Transdermal Daily    pantoprazole  (PROTONIX ) IV  40 mg Intravenous Q12H   Continuous Infusions:  piperacillin-tazobactam (ZOSYN)  IV Stopped (06/15/24 0904)     LOS: 2 days   Ivonne Mustache, MD [facility] P10/02/2024, 11:54 AM

## 2024-06-16 ENCOUNTER — Ambulatory Visit (INDEPENDENT_AMBULATORY_CARE_PROVIDER_SITE_OTHER): Payer: Self-pay | Admitting: Primary Care

## 2024-06-16 ENCOUNTER — Other Ambulatory Visit (HOSPITAL_COMMUNITY): Payer: Self-pay

## 2024-06-16 MED ORDER — BLOOD PRESSURE MONITOR MISC
1.0000 | Freq: Two times a day (BID) | 0 refills | Status: AC
  Filled 2024-06-16: qty 1, 30d supply, fill #0

## 2024-06-16 MED ORDER — NICOTINE 14 MG/24HR TD PT24
14.0000 mg | MEDICATED_PATCH | Freq: Every day | TRANSDERMAL | 0 refills | Status: AC
  Filled 2024-06-16: qty 28, 28d supply, fill #0

## 2024-06-16 MED ORDER — PANTOPRAZOLE SODIUM 40 MG PO TBEC
40.0000 mg | DELAYED_RELEASE_TABLET | Freq: Two times a day (BID) | ORAL | 0 refills | Status: AC
  Filled 2024-06-16: qty 120, 60d supply, fill #0

## 2024-06-16 MED ORDER — OXYCODONE HCL 5 MG PO TABS
5.0000 mg | ORAL_TABLET | Freq: Three times a day (TID) | ORAL | 0 refills | Status: AC | PRN
  Filled 2024-06-16: qty 10, 4d supply, fill #0

## 2024-06-16 MED ORDER — DICYCLOMINE HCL 20 MG PO TABS
20.0000 mg | ORAL_TABLET | Freq: Three times a day (TID) | ORAL | Status: DC
  Administered 2024-06-16: 20 mg via ORAL
  Filled 2024-06-16: qty 1

## 2024-06-16 MED ORDER — SODIUM CHLORIDE (PF) 0.9 % IJ SOLN
INTRAMUSCULAR | Status: AC
  Administered 2024-06-16: 10 mL
  Filled 2024-06-16: qty 10

## 2024-06-16 NOTE — TOC Transition Note (Signed)
 Transition of Care [facility]) - Discharge Note   Patient Details  Name: Jeremy Randall MRN: [id number] Date of Birth: Mar 14, 1971  Transition of Care [facility]) CM/SW Contact:  Tom-Johnson, Harvest Muskrat, RN Phone Number: 06/16/2024, 11:12 AM   Clinical Narrative:     Patient is scheduled for discharge today.  Readmission Risk Assessment done. Outpatient f/u, hospital f/u and discharge instructions on AVS. Prescriptions sent to [facility] pharmacy and patient will receive meds prior discharge. No ICM needs or recommendations noted. Fiance, [name] at bedside and will transport at discharge.  No further ICM needs noted.        Final next level of care: Home/Self Care Barriers to Discharge: Barriers Resolved   Patient Goals and CMS Choice Patient states their goals for this hospitalization and ongoing recovery are:: To return home CMS Medicare.gov Compare Post Acute Care list provided to:: Patient Choice offered to / list presented to : NA      Discharge Placement                Patient to be transferred to facility by: Fiance Name of family member notified: [name]    Discharge Plan and Services Additional resources added to the After Visit Summary for                  DME Arranged: N/A DME Agency: NA       HH Arranged: NA HH Agency: NA        Social Drivers of Health (SDOH) Interventions SDOH Screenings   Food Insecurity: No Food Insecurity (06/13/2024)  Housing: Low Risk  (06/13/2024)  Transportation Needs: No Transportation Needs (06/13/2024)  Utilities: Not At Risk (06/13/2024)  Depression (PHQ2-9): Low Risk  (06/21/2020)  Social Connections: Unknown (01/23/2022)   Received from [facility]  Tobacco Use: High Risk (06/13/2024)     Readmission Risk Interventions    06/15/2024    4:59 PM  Readmission Risk Prevention Plan  Post Dischage Appt Complete  Medication Screening Complete  Transportation Screening Complete

## 2024-06-16 NOTE — Plan of Care (Signed)
  Problem: Clinical Measurements: Goal: Ability to maintain clinical measurements within normal limits will improve Outcome: Adequate for Discharge Goal: Will remain free from infection Outcome: Adequate for Discharge Goal: Diagnostic test results will improve Outcome: Adequate for Discharge Goal: Respiratory complications will improve Outcome: Adequate for Discharge Goal: Cardiovascular complication will be avoided Outcome: Adequate for Discharge   Problem: Activity: Goal: Risk for activity intolerance will decrease Outcome: Adequate for Discharge   Problem: Nutrition: Goal: Adequate nutrition will be maintained Outcome: Adequate for Discharge   Problem: Coping: Goal: Level of anxiety will decrease Outcome: Adequate for Discharge   Problem: Elimination: Goal: Will not experience complications related to bowel motility Outcome: Adequate for Discharge   Problem: Elimination: Goal: Will not experience complications related to bowel motility Outcome: Adequate for Discharge Goal: Will not experience complications related to urinary retention Outcome: Adequate for Discharge   Problem: Pain Managment: Goal: General experience of comfort will improve and/or be controlled Outcome: Adequate for Discharge   Problem: Safety: Goal: Ability to remain free from injury will improve Outcome: Adequate for Discharge   Problem: Skin Integrity: Goal: Risk for impaired skin integrity will decrease Outcome: Adequate for Discharge

## 2024-06-16 NOTE — Progress Notes (Signed)
 Subjective: Patient reports having 2-3 loose dark bowel movements yesterday.  He states his abdomen feels less distended.  He has been started on full liquid diet and is tolerating it well.  Objective: Vital signs in last 24 hours: Temp:  [98.2 F (36.8 C)-98.8 F (37.1 C)] 98.2 F (36.8 C) (10/07 0832) Pulse Rate:  [53-57] 56 (10/07 0832) Resp:  [18] 18 (10/07 0832) BP: (127-174)/(83-92) 146/88 (10/07 0832) SpO2:  [98 %-100 %] 100 % (10/07 9167) Weight change:  Last BM Date : 06/16/24  PE: Nonicteric GENERAL: No pallor  ABDOMEN: Soft, nondistended, nontender, normoactive bowel sounds EXTREMITIES: No deformity  Lab Results: No results found for this or any previous visit (from the past 48 hours).  Studies/Results: DG UGI W SINGLE CM (SOL OR THIN BA) Result Date: 06/15/2024 CLINICAL DATA:  Three weeks of epigastric pain progressively worsening. Nausea and vomiting x3 days. CT abdomen pelvis 06/13/2024 prominent wall thickening of the distal stomach with adjacent fat stranding, suspicious for ulcer versus contained perforation. Consult for water soluble upper GI study for further evaluation. EXAM: DG UGI W SINGLE CM TECHNIQUE: Single contrast examination was then performed using water soluble contrast (Omnipaque  300). This exam was performed by Kimble Clas, [name], and was supervised and interpreted by Dr. Dasie Hamburg. FLUOROSCOPY: Radiation Exposure Index (as provided by the fluoroscopic device): 63.20 mGy Kerma COMPARISON:  CT abdomen and pelvis 06/13/2024 FINDINGS: The examination was limited secondary to poor patient mobility. The patient was unable to stand or significantly roll on the fluoroscopy table. Esophagus:  Normal appearance. Esophageal motility:  Within normal limits. Gastroesophageal reflux:  None visualized. Ingested 13 mm barium tablet: Not given. Stomach: Small sliding hiatal hernia. Appearance of luminal narrowing in the distal stomach in the region of the pylorus  corresponding to wall thickening on CT. No evidence of contrast extravasation. Gastric emptying: Normal. Duodenum: Normal appearance. No evidence of contrast extravasation. Other:  None. IMPRESSION: 1. Wall thickening of the distal stomach as shown on CT. No evidence of gastric or duodenal perforation. 2. Small sliding hiatal hernia. Electronically Signed   By: Dasie Hamburg M.D.   On: 06/15/2024 11:05    Medications: I have reviewed the patient's current medications.  Assessment: Suspected contained ulcer of stomach/duodenum on CAT scan  Upper GI series shows wall thickening of distal stomach without evidence of gastric or duodenal perforation  Hemoglobin stable Hemodynamically stable  Plan: Tolerated full liquid yesterday evening for dinner and today morning for breakfast, will advance to soft diet for lunch. If he remains without pain, okay to DC home today afternoon on pantoprazole  40 mg twice a day. We will plan for outpatient EGD in the next 4 to 6 weeks. We will also plan screening colonoscopy at the same time. Both can be done as an outpatient. Patient has been advised to refrain from alcohol use and avoid aspirin , Goody powders and NSAIDs. H. pylori stool antigen has been sent, results can be followed as outpatient.  Estelita Manas, MD 06/16/2024, 8:38 AM

## 2024-06-16 NOTE — Progress Notes (Signed)
 DISCHARGE NOTE HOME Korbin Mapps to be discharged Home per MD order. Discussed prescriptions and follow up appointments with the patient. Prescriptions given to patient; medication list explained in detail. Patient verbalized understanding.  Skin clean, dry and intact without evidence of skin break down, no evidence of skin tears noted. IV catheter discontinued intact. Site without signs and symptoms of complications. Dressing and pressure applied. Pt denies pain at the site currently. No complaints noted.  Patient free of lines, drains, and wounds.   An After Visit Summary (AVS) was printed and given to the patient. Patient escorted via wheelchair to d/c lounge to await TOC meds. Pt given written information and reviewed with him for him to stop smoking. He tolerated soft diet without problem.  Makinzee Durley A Proctor-Gann, RN

## 2024-06-16 NOTE — Discharge Summary (Signed)
 Physician Discharge Summary  Jeremy Randall FMW:[id number] DOB: 1971/02/29 DOA: 06/13/2024  PCP: Celestia Rosaline SQUIBB, NP  Admit date: 06/13/2024 Discharge date: 06/16/2024  Admitted From: Home Disposition:  Home  Discharge Condition:Stable CODE STATUS:FULL Diet recommendation: soft diet for next 2-3 days  Brief/Interim Summary:  Patient is a 53 year old male with history of asthma, tobacco use, history of dental abscess who presented with complaint of nausea, vomiting, abdominal pain.  Abdominal pain was mainly in the epigastric region.  Symptoms ongoing for 2 weeks.  CT abdomen/pelvis showed thickening of his distal stomach and duodenum, possibility of contained perforated peptic ulcer.  General surgery consulted.  Planned for conservative management.  Follow-up upper GI series  showed wall thickening of the distal stomach ,no evidence of gastric or duodenal perforation.  Diet has been advanced and he is tolerating.  Medically stable for discharge home today.  He will continue Protonix  on discharge.  He will be called for GI for EGD/colonoscopy as an outpatient.   Following problems were addressed during the hospitalization:   Contained perforated peptic ulcer: He presented with complaint of nausea, vomiting, abdominal pain. CT abdomen/pelvis showed thickening of his distal stomach and duodenum, possibility of contained perforated peptic ulcer.  General surgery consulted.  Planned for conservative management.  Follow-up upper GI series  showed wall thickening of the distal stomach ,no evidence of gastric or duodenal perforation.  Diet has been advanced and he is tolerating.  Medically stable for discharge home today.  He will continue Protonix  on discharge.  He will be called for GI for EGD/colonoscopy as an outpatient.   History of asthma: Currently not in exacerbation   Tobacco use: On nicotine patch   Elevated blood pressure: Blood pressure fluctuating.  Not taking any antihypertensives at  home.  We recommend to follow-up with his PCP in a week and monitor his blood pressure at home  Discharge Diagnoses:  Principal Problem:   Perforated ulcer (HCC) Active Problems:   Atrial fibrillation (HCC)   Gastritis    Discharge Instructions  Discharge Instructions     Diet general   Complete by: As directed    Soft diet for next 2-3 days   Discharge instructions   Complete by: As directed    1)Please take your medications as instructed 2)Stop smoking.  Do not take ibuprofen , aspirin  3)You can be called by gastroenterology for follow-up appointment in the next few weeks for EGD/colonoscopy 4)Take soft diet for next 2 to 3 days at home   Increase activity slowly   Complete by: As directed       Allergies as of 06/16/2024   No Known Allergies      Medication List     STOP taking these medications    amoxicillin -clavulanate 875-125 MG tablet Commonly known as: AUGMENTIN    ibuprofen  200 MG tablet Commonly known as: ADVIL        TAKE these medications    acetaminophen  500 MG tablet Commonly known as: TYLENOL  Take 500 mg by mouth every 6 (six) hours as needed for moderate pain (pain score 4-6).   dicyclomine  20 MG tablet Commonly known as: BENTYL  Take 1 tablet (20 mg total) by mouth 3 (three) times daily as needed for abdominal pain.   nicotine 14 mg/24hr patch Commonly known as: NICODERM CQ - dosed in mg/24 hours Place 1 patch (14 mg total) onto the skin daily. Start taking on: June 17, 2024   oxyCODONE  5 MG immediate release tablet Commonly known as: Roxicodone  Take 1 tablet (5  mg total) by mouth every 8 (eight) hours as needed for up to 7 days.   pantoprazole  40 MG tablet Commonly known as: Protonix  Take 1 tablet (40 mg total) by mouth 2 (two) times daily.   sucralfate  1 g tablet Commonly known as: Carafate  Take 1 tablet (1 g total) by mouth 4 (four) times daily -  with meals and at bedtime.        Follow-up Information     Celestia Rosaline SQUIBB, NP. Schedule an appointment as soon as possible for a visit in 1 week(s).   Specialty: Internal Medicine Contact information: [address] [place] [address] [phone]                No Known Allergies  Consultations: GI,general surgery   Procedures/Studies: DG UGI W SINGLE CM (SOL OR THIN BA) Result Date: 06/15/2024 CLINICAL DATA:  Three weeks of epigastric pain progressively worsening. Nausea and vomiting x3 days. CT abdomen pelvis 06/13/2024 prominent wall thickening of the distal stomach with adjacent fat stranding, suspicious for ulcer versus contained perforation. Consult for water soluble upper GI study for further evaluation. EXAM: DG UGI W SINGLE CM TECHNIQUE: Single contrast examination was then performed using water soluble contrast (Omnipaque  300). This exam was performed by Kimble Clas, [name], and was supervised and interpreted by Dr. Dasie Hamburg. FLUOROSCOPY: Radiation Exposure Index (as provided by the fluoroscopic device): 63.20 mGy Kerma COMPARISON:  CT abdomen and pelvis 06/13/2024 FINDINGS: The examination was limited secondary to poor patient mobility. The patient was unable to stand or significantly roll on the fluoroscopy table. Esophagus:  Normal appearance. Esophageal motility:  Within normal limits. Gastroesophageal reflux:  None visualized. Ingested 13 mm barium tablet: Not given. Stomach: Small sliding hiatal hernia. Appearance of luminal narrowing in the distal stomach in the region of the pylorus corresponding to wall thickening on CT. No evidence of contrast extravasation. Gastric emptying: Normal. Duodenum: Normal appearance. No evidence of contrast extravasation. Other:  None. IMPRESSION: 1. Wall thickening of the distal stomach as shown on CT. No evidence of gastric or duodenal perforation. 2. Small sliding hiatal hernia. Electronically Signed   By: Dasie Hamburg M.D.   On: 06/15/2024 11:05   CT ABDOMEN PELVIS W  CONTRAST Result Date: 06/13/2024 CLINICAL DATA:  Provided history: Pancreatitis, acute, severe Radiologic records indicate right upper quadrant pain. EXAM: CT ABDOMEN AND PELVIS WITH CONTRAST TECHNIQUE: Multidetector CT imaging of the abdomen and pelvis was performed using the standard protocol following bolus administration of intravenous contrast. RADIATION DOSE REDUCTION: This exam was performed according to the departmental dose-optimization program which includes automated exposure control, adjustment of the mA and/or kV according to patient size and/or use of iterative reconstruction technique. CONTRAST:  OMNIPAQUE  IOHEXOL  300 MG/ML  SOLN COMPARISON:  Ultrasound earlier today.  CT 09/08/2023 FINDINGS: Lower chest: Subsegmental atelectasis in the right lower lobe. No pleural effusion. Hepatobiliary: Scattered tiny hepatic hypodensities are too small to characterize. No suspicious liver lesion. Gallbladder physiologically distended, no calcified stone. No biliary dilatation. Pancreas: Homogeneous attenuation and enhancement. No peripancreatic fat stranding or inflammation. No evidence of pancreatic mass or focal fluid collection. Pancreatic duct is upper normal at 3 mm. Spleen: Normal in size without focal abnormality. Adrenals/Urinary Tract: 2.9 cm left adrenal nodule is unchanged from prior exam. The right adrenal gland is normal. No hydronephrosis or perinephric edema. Homogeneous renal enhancement with symmetric excretion on delayed phase imaging. Urinary bladder is partially distended. No bladder wall thickening for degree  of distension. Stomach/Bowel: Prominent wall thickening about the distal stomach with adjacent fat stranding, for example series 2, image 35. Outpouching containing fluid and air, series 2, image 32 and sagittal series 5, image 62 may represent is suspicious for ulcer and contained perforation. Mild wall hyperemia. No free air. No small bowel distension or obstruction. Moderate  volume of stool in the right colon. The appendix is not visualized, history of appendectomy per electronic records. Vascular/Lymphatic: Aortic atherosclerosis. No aneurysm. The portal and splenic veins are patent. Few prominent and enlarged upper abdominal lymph nodes. This includes 11 mm perigastric node series 2, image 38 and peripancreatic/perigastric node measuring 8 mm, series 2, image 27. Reproductive: Prostate is unremarkable. Other: No free air.  No free fluid.  No abdominal wall hernia. Musculoskeletal: Mild L4-L5 degenerative disc disease. There are no acute or suspicious osseous abnormalities. IMPRESSION: 1. Prominent wall thickening about the distal stomach with adjacent fat stranding. Outpouching containing fluid and air may represent ulcer and contained perforation. No free air. Recommend GI and consider surgical consultation. 2. Few prominent and enlarged upper abdominal lymph nodes are likely reactive. 3. No CT findings of acute pancreatitis. 4. Stable 2.9 cm left adrenal nodule, likely adenoma. Aortic Atherosclerosis (ICD10-I70.0). Electronically Signed   By: Andrea Gasman M.D.   On: 06/13/2024 15:47   US  Abdomen Limited RUQ (LIVER/GB) Result Date: 06/13/2024 CLINICAL DATA:  151471 RUQ pain 151471 EXAM: ULTRASOUND ABDOMEN LIMITED RIGHT UPPER QUADRANT COMPARISON:  September 08, 2023 FINDINGS: Gallbladder: Small volume biliary sludge. No gallstones. No wall thickening or pericholecystic fluid. No sonographic Murphy's sign noted by sonographer. Common bile duct: Diameter: 2 mm Liver: Normal echogenicity. No focal lesion identified. No intrahepatic biliary ductal dilation. Portal vein is patent on color Doppler imaging with normal direction of blood flow towards the liver. Other: None. IMPRESSION: Small volume layering biliary sludge. No cholecystolithiasis or changes of acute cholecystitis. Electronically Signed   By: Rogelia Myers M.D.   On: 06/13/2024 14:17      Subjective: Patient seen  and examined at bedside today.  Tolerating diet.  Complains of some abdominal cramps but not significant like before.  No nausea or vomiting.  Abdomen is benign on examination.  Had some bowel movement earlier this morning.  Medically stable for discharge home.  Discharge Exam: Vitals:   06/16/24 0507 06/16/24 0832  BP: 127/83 (!) 146/88  Pulse: (!) 53 (!) 56  Resp: 18 18  Temp: 98.6 F (37 C) 98.2 F (36.8 C)  SpO2: 98% 100%   Vitals:   06/15/24 1654 06/15/24 1955 06/16/24 0507 06/16/24 0832  BP: (!) 174/92 (!) 138/91 127/83 (!) 146/88  Pulse: (!) 55 (!) 57 (!) 53 (!) 56  Resp: 18 18 18 18   Temp: 98.6 F (37 C) 98.8 F (37.1 C) 98.6 F (37 C) 98.2 F (36.8 C)  TempSrc: Oral Oral  Oral  SpO2: 100% 98% 98% 100%  Weight:      Height:        General: Pt is alert, awake, not in acute distress Cardiovascular: RRR, S1/S2 +, no rubs, no gallops Respiratory: CTA bilaterally, no wheezing, no rhonchi Abdominal: Soft, NT, ND, bowel sounds + Extremities: no edema, no cyanosis    The results of significant diagnostics from this hospitalization (including imaging, microbiology, ancillary and laboratory) are listed below for reference.     Microbiology: No results found for this or any previous visit (from the past 240 hours).   Labs: BNP (last 3 results) No results  for input(s): BNP in the last 8760 hours. Basic Metabolic Panel: Recent Labs  Lab 06/13/24 1121 06/14/24 0306  NA 136 133*  K 3.8 3.7  CL 99 102  CO2 26 23  GLUCOSE 99 111*  BUN 7 7  CREATININE 0.95 0.99  CALCIUM 9.9 8.7*   Liver Function Tests: Recent Labs  Lab 06/13/24 1121 06/14/24 0306  AST 14* 11*  ALT 8 6  ALKPHOS 80 54  BILITOT 0.6 0.7  PROT 7.8 6.4*  ALBUMIN 4.3 3.2*   Recent Labs  Lab 06/13/24 1121  LIPASE 30   No results for input(s): AMMONIA in the last 168 hours. CBC: Recent Labs  Lab 06/13/24 1121 06/14/24 0306  WBC 6.6 5.9  NEUTROABS 3.7  --   HGB 15.0 13.8  HCT 43.5  39.7  MCV 86.3 85.0  PLT 243 236   Cardiac Enzymes: No results for input(s): CKTOTAL, CKMB, CKMBINDEX, TROPONINI in the last 168 hours. BNP: Invalid input(s): POCBNP CBG: No results for input(s): GLUCAP in the last 168 hours. D-Dimer No results for input(s): DDIMER in the last 72 hours. Hgb A1c No results for input(s): HGBA1C in the last 72 hours. Lipid Profile No results for input(s): CHOL, HDL, LDLCALC, TRIG, CHOLHDL, LDLDIRECT in the last 72 hours. Thyroid function studies No results for input(s): TSH, T4TOTAL, T3FREE, THYROIDAB in the last 72 hours.  Invalid input(s): FREET3 Anemia work up No results for input(s): VITAMINB12, FOLATE, FERRITIN, TIBC, IRON, RETICCTPCT in the last 72 hours. Urinalysis    Component Value Date/Time   COLORURINE YELLOW 06/13/2024 1423   APPEARANCEUR CLEAR 06/13/2024 1423   LABSPEC 1.015 06/13/2024 1423   PHURINE 7.0 06/13/2024 1423   GLUCOSEU NEGATIVE 06/13/2024 1423   HGBUR NEGATIVE 06/13/2024 1423   BILIRUBINUR NEGATIVE 06/13/2024 1423   KETONESUR 15 (A) 06/13/2024 1423   PROTEINUR NEGATIVE 06/13/2024 1423   UROBILINOGEN 1.0 01/05/2021 1525   NITRITE NEGATIVE 06/13/2024 1423   LEUKOCYTESUR NEGATIVE 06/13/2024 1423   Sepsis Labs Recent Labs  Lab 06/13/24 1121 06/14/24 0306  WBC 6.6 5.9   Microbiology No results found for this or any previous visit (from the past 240 hours).  Please note: You were cared for by a hospitalist during your hospital stay. Once you are discharged, your primary care physician will handle any further medical issues. Please note that NO REFILLS for any discharge medications will be authorized once you are discharged, as it is imperative that you return to your primary care physician (or establish a relationship with a primary care physician if you do not have one) for your post hospital discharge needs so that they can reassess your need for medications and  monitor your lab values.    Time coordinating discharge: 40 minutes  SIGNED:   Ivonne Mustache, MD  [facility] 06/16/2024, 10:39 AM Pager [phone]  If 7PM-7AM, please contact night-coverage [email or web address] Password [email or web address]

## 2024-06-17 ENCOUNTER — Telehealth (INDEPENDENT_AMBULATORY_CARE_PROVIDER_SITE_OTHER): Payer: Self-pay

## 2024-06-17 LAB — H. PYLORI ANTIGEN, STOOL: H. Pylori Stool Ag, Eia: NEGATIVE

## 2024-06-17 NOTE — Transitions of Care (Post Inpatient/ED Visit) (Signed)
   06/17/2024  Name: Jeremy Randall MRN: [id number] DOB: 22-Sep-1970  Today's TOC FU Call Status: Today's TOC FU Call Status:: Successful TOC FU Call Completed TOC FU Call Complete Date: 06/17/24 Patient's Name and Date of Birth confirmed.  Transition Care Management Follow-up Telephone Call Date of Discharge: 06/16/24 Discharge Facility: [facility]) Type of Discharge: Inpatient Admission Primary Inpatient Discharge Diagnosis:: perforated ulcer How have you been since you were released from the hospital?: Better Any questions or concerns?: No  Items Reviewed: Did you receive and understand the discharge instructions provided?: Yes Medications obtained,verified, and reconciled?: Yes (Medications Reviewed) Any new allergies since your discharge?: No Dietary orders reviewed?: Yes  Medications Reviewed Today: Medications Reviewed Today     Reviewed by Emmitt Pan, LPN (Licensed Practical Nurse) on 06/17/24 at 1137  Med List Status: <None>   Medication Order Taking? Sig Documenting Provider Last Dose Status Informant  acetaminophen  (TYLENOL ) 500 MG tablet [id number] Yes Take 500 mg by mouth every 6 (six) hours as needed for moderate pain (pain score 4-6). [provider]  Active Self, Spouse/Significant Other  Blood Pressure Monitor MISC [id number] Yes Use 2 (two) times daily. Jillian Buttery, MD  Active   dicyclomine  (BENTYL ) 20 MG tablet [id number] Yes Take 1 tablet (20 mg total) by mouth 3 (three) times daily as needed for abdominal pain.   Active Self, Spouse/Significant Other  nicotine (NICODERM CQ - DOSED IN MG/24 HOURS) 14 mg/24hr patch [id number] Yes Place 1 patch (14 mg total) onto the skin daily. Jillian Buttery, MD  Active   oxyCODONE  (ROXICODONE ) 5 MG immediate release tablet [id number] Yes Take 1 tablet (5 mg total) by mouth every 8 (eight) hours as needed for up to 7 days. Jillian Buttery, MD  Active   pantoprazole  (PROTONIX ) 40 MG tablet [id number] Yes Take 1 tablet  (40 mg total) by mouth 2 (two) times daily. Jillian Buttery, MD  Active   sucralfate  (CARAFATE ) 1 g tablet [id number] Yes Take 1 tablet (1 g total) by mouth 4 (four) times daily -  with meals and at bedtime. Desiderio Chew, [name]  Active Self, Spouse/Significant Other            Home Care and Equipment/Supplies: Any new equipment or medical supplies ordered?: NA  Functional Questionnaire: Do you need assistance with bathing/showering or dressing?: No Do you need assistance with meal preparation?: No Do you need assistance with eating?: No Do you have difficulty maintaining continence: No Do you need assistance with getting out of bed/getting out of a chair/moving?: No Do you have difficulty managing or taking your medications?: No  Follow up appointments reviewed: PCP Follow-up appointment confirmed?: Yes Date of PCP follow-up appointment?: 06/26/24 Follow-up Provider: [facility] Follow-up appointment confirmed?: No Reason Specialist Follow-Up Not Confirmed: Patient has Specialist Provider Number and will Call for Appointment Do you need transportation to your follow-up appointment?: No Do you understand care options if your condition(s) worsen?: Yes-patient verbalized understanding    SIGNATURE Pan Emmitt, LPN [facility] Nurse Health Advisor Direct Dial [phone]

## 2024-06-22 ENCOUNTER — Other Ambulatory Visit: Payer: Self-pay

## 2024-06-22 ENCOUNTER — Ambulatory Visit: Payer: Self-pay

## 2024-06-22 ENCOUNTER — Ambulatory Visit
Admission: RE | Admit: 2024-06-22 | Discharge: 2024-06-22 | Disposition: A | Discharge status: Home / Self Care | Payer: Self-pay | Attending: Physician Assistant | Admitting: Physician Assistant

## 2024-06-22 VITALS — BP 155/89 | HR 66 | Temp 99.7°F | Resp 17

## 2024-06-22 DIAGNOSIS — L03113 Cellulitis of right upper limb: Secondary | ICD-10-CM

## 2024-06-22 DIAGNOSIS — M79601 Pain in right arm: Secondary | ICD-10-CM

## 2024-06-22 MED ORDER — CEPHALEXIN 500 MG PO CAPS
500.0000 mg | ORAL_CAPSULE | Freq: Four times a day (QID) | ORAL | 0 refills | Status: AC
  Filled 2024-06-22: qty 28, 7d supply, fill #0

## 2024-06-22 NOTE — Discharge Instructions (Addendum)
 VISIT SUMMARY:  You came in today because of pain and swelling in your arm following an IV insertion a week ago. We examined the area and determined that you likely have cellulitis, which is an infection of the skin and tissues beneath it.  YOUR PLAN:  -CELLULITIS OF RIGHT UPPER LIMB: Cellulitis is an infection of the skin and tissues beneath it, often caused by bacteria. You have been prescribed Keflex  to take every six hours for seven days to treat the infection. For pain management, you can take Tylenol , but do not exceed 3500 mg in 24 hours; this means you can take two 500 mg tablets three times a day. Apply warm compresses to the affected area to help reduce swelling and discomfort. Avoid taking NSAIDs like Advil  or ibuprofen . If you notice increased redness, bruising, swelling, or if the area becomes hard, seek emergency care immediately.  INSTRUCTIONS:  Please follow up with your doctor if there is no improvement after completing the antibiotic course or if symptoms worsen. Seek emergency care if you notice increased redness, bruising, swelling, or if the area becomes hard.

## 2024-06-22 NOTE — Telephone Encounter (Signed)
 FYI Only or Action Required?: FYI only for provider.  Patient was last seen in primary care on 09/20/2023 by Celestia Rosaline SQUIBB, NP.  Called Nurse Triage reporting Hospitalization Follow-up and Arm Swelling.  Symptoms began a week ago.  Interventions attempted: Rest, hydration, or home remedies.  Symptoms are: unchanged.  Triage Disposition: See HCP Within 4 Hours (Or PCP Triage)  Patient/caregiver understands and will follow disposition?: Yes        Copied from CRM [id number]. Topic: Clinical - Red Word Triage >> Jun 22, 2024 11:11 AM Jeremy Randall wrote: Red Word that prompted transfer to Nurse Triage: Pt was discharged from hospital last Tuesday. He is still having soreness and swelling in R arm where IV was. He says his vein feels tight. Warm transfer to NT. Reason for Disposition  [1] Red streak at IV site AND [2] palpable cord    [facility] attempted to schedule with PCP, but no access. Scheduled with [facility].  Answer Assessment - Initial Assessment Questions 1. MAIN CONCERN OR SYMPTOM:  What is your main concern right now? What question do you have? What's the main symptom you're worried about? (e.g., breathing difficulty, ankle swelling, weight gain.)     Concern for swelling at previous IV site - R forearm 2. ONSET: When did the  sx  start?     Upon discharge. 3. BETTER-SAME-WORSE: Are you getting better, staying the same, or getting worse compared to the day you were discharged?     better 4. HOSPITALIZATION: How long were you hospitalized? (e.g., days)     3 days 5. DISCHARGE DIAGNOSIS:  What problem or disease were you hospitalized for?       6. DISCHARGE DATE: What date were you discharged from the hospital?     06/16/24 7. DISCHARGE DOCTOR: Who is the main doctor taking care of you now?     ulcer 8. DISCHARGE APPOINTMENT: Have you scheduled a follow-up discharge appointment with your doctor?     06/26/2024 9. DISCHARGE MEDICINES: Did the doctor  (or NP/PA) who discharged you order any new medicines for you to use? If yes, have you filled the prescription and started taking the medicine?      No not really 10. PAIN: Is there any pain? If Yes, ask: How bad is it?  (Scale 0-10; or none, mild, moderate, severe)       Endorses pain/swelling from old IV site 40. FEVER: Do you have a fever? If Yes, ask: What is it, how was it measured  and when did it start?       denies 12. OTHER SYMPTOMS: Do you have any other symptoms?       denies  Protocols used: Post-Hospitalization Follow-up Call-A-AH, IV Site and Other Symptoms-A-AH

## 2024-06-22 NOTE — ED Provider Notes (Addendum)
 [facility]    CSN: [id number] Arrival date & time: 06/22/24  1516      History   Chief Complaint Chief Complaint  Patient presents with   Arm Injury    REDNESS, SWELLING FROM PIV - Entered by patient    HPI Jeremy Randall is a 53 y.o. male.  has a past medical history of Asthma.   HPI  Discussed the use of AI scribe software for clinical note transcription with the patient, who gave verbal consent to proceed.  The patient presents with arm pain and swelling following an IV insertion a week ago.  The patient has been experiencing arm pain and swelling at the site of an IV insertion that occurred a week ago during a hospital stay. The IV was in place for three days, and since its removal, he has noticed swelling and bruising in the area.  The pain has been worsening over time, with no improvement despite the passage of several days. He has not taken any medication for the pain and has not been able to see his doctor for a follow-up regarding this issue.  There is concern about the possibility of an infection, as he notes warmth in the area.  No allergies. He has not been taking NSAIDs due to previous advice against it. He inquires about the strength of Tylenol , indicating a need for effective pain management.    Past Medical History:  Diagnosis Date   Asthma     Patient Active Problem List   Diagnosis Date Noted   Perforated ulcer (HCC) 06/13/2024   Pain, dental 05/26/2014   H/O dental abscess 05/26/2014   Gastritis 05/26/2014   Smoking 05/26/2014   Atrial fibrillation (HCC) 05/10/2012   Abnormal finding on EKG 05/10/2012   Nausea and vomiting 05/10/2012   Prolonged Q-T interval on ECG 05/10/2012   Hypokalemia 05/10/2012    Past Surgical History:  Procedure Laterality Date   APPENDECTOMY     PATELLAR TENDON REPAIR         Home Medications    Prior to Admission medications   Medication Sig Start Date End Date Taking? Authorizing Provider   Blood Pressure Monitor MISC Use 2 (two) times daily. 06/16/24  Yes Jillian Buttery, MD  cephALEXin  (KEFLEX ) 500 MG capsule Take 1 capsule (500 mg total) by mouth 4 (four) times daily for 7 days. 06/22/24 06/29/24 Yes Briceson Broadwater E, [name]  dicyclomine  (BENTYL ) 20 MG tablet Take 1 tablet (20 mg total) by mouth 3 (three) times daily as needed for abdominal pain. 10/03/23  Yes   oxyCODONE  (ROXICODONE ) 5 MG immediate release tablet Take 1 tablet (5 mg total) by mouth every 8 (eight) hours as needed for up to 7 days. 06/16/24 06/23/24 Yes Jillian Buttery, MD  pantoprazole  (PROTONIX ) 40 MG tablet Take 1 tablet (40 mg total) by mouth 2 (two) times daily. 06/16/24 08/15/24 Yes Jillian Buttery, MD  sucralfate  (CARAFATE ) 1 g tablet Take 1 tablet (1 g total) by mouth 4 (four) times daily -  with meals and at bedtime. 09/12/23  Yes Desiderio Chew, [name]  acetaminophen  (TYLENOL ) 500 MG tablet Take 500 mg by mouth every 6 (six) hours as needed for moderate pain (pain score 4-6). Patient not taking: Reported on 06/22/2024    [provider]  nicotine (NICODERM CQ - DOSED IN MG/24 HOURS) 14 mg/24hr patch Place 1 patch (14 mg total) onto the skin daily. 06/17/24   Jillian Buttery, MD    Family History Family History  Problem  Relation Age of Onset   Diabetes Mother    Hypertension Mother    Cancer Sister    Hypertension Sister    Hypertension Father    Diabetes Maternal Aunt     Social History Social History   Tobacco Use   Smoking status: Every Day    Current packs/day: 0.50    Average packs/day: 0.5 packs/day for 25.0 years (12.5 ttl pk-yrs)    Types: Cigarettes   Smokeless tobacco: Never  Vaping Use   Vaping status: Never Used  Substance Use Topics   Alcohol use: Yes    Comment: rare   Drug use: Not Currently    Types: Marijuana    Comment: occ     Allergies   Patient has no known allergies.   Review of Systems Review of Systems  Respiratory:  Negative for shortness of breath and  wheezing.   Musculoskeletal:  Positive for myalgias.  Skin:  Positive for color change.       Right antecubital redness and swelling.       Physical Exam Triage Vital Signs ED Triage Vitals [06/22/24 1535]  Encounter Vitals Group     BP (!) 155/89     Girls Systolic BP Percentile      Girls Diastolic BP Percentile      Boys Systolic BP Percentile      Boys Diastolic BP Percentile      Pulse Rate 66     Resp 17     Temp 99.7 F (37.6 C)     Temp Source Oral     SpO2 99 %     Weight      Height      Head Circumference      Peak Flow      Pain Score      Pain Loc      Pain Education      Exclude from Growth Chart    No data found.  Updated Vital Signs BP (!) 155/89 (BP Location: Left Arm)   Pulse 66   Temp 99.7 F (37.6 C) (Oral)   Resp 17   SpO2 99%   Visual Acuity Right Eye Distance:   Left Eye Distance:   Bilateral Distance:    Right Eye Near:   Left Eye Near:    Bilateral Near:     Physical Exam Vitals reviewed.  Constitutional:      General: He is awake.     Appearance: Normal appearance. He is well-developed and well-groomed.  HENT:     Head: Normocephalic and atraumatic.  Eyes:     Extraocular Movements: Extraocular movements intact.     Conjunctiva/sclera: Conjunctivae normal.  Pulmonary:     Effort: Pulmonary effort is normal.  Musculoskeletal:     Right elbow: Swelling present. Normal range of motion.     Right forearm: Swelling and tenderness present.       Arms:     Cervical back: Normal range of motion.     Comments: Pt is able to flex and extend at the right elbow but notes that it is uncomfortable. Elbow and forearm is supple without evidence of severe swelling or firmness. Radial pulses are 2+, brisk and symmetrical bilaterally.   Neurological:     Mental Status: He is alert and oriented to person, place, and time.  Psychiatric:        Attention and Perception: Attention normal.        Mood and Affect: Mood normal.  Speech:  Speech normal.        Behavior: Behavior normal. Behavior is cooperative.      UC Treatments / Results  Labs (all labs ordered are listed, but only abnormal results are displayed) Labs Reviewed - No data to display  EKG   Radiology No results found.  Procedures Procedures (including critical care time)  Medications Ordered in UC Medications - No data to display  Initial Impression / Assessment and Plan / UC Course  I have reviewed the triage vital signs and the nursing notes.  Pertinent labs & imaging results that were available during my care of the patient were reviewed by me and considered in my medical decision making (see chart for details).      Final Clinical Impressions(s) / UC Diagnoses   Final diagnoses:  Arm pain, anterior, right  Cellulitis of right upper extremity   Cellulitis of right upper limb Swelling, bruising, pain, and warmth in the right upper limb following IV placement a week ago, suggestive of cellulitis. Differential includes skin infection, IV infiltration, superficial thrombophlebitis. No difficulty bending the elbow. No known allergies. No current medications for the condition. Area is supple with mild bruising, erythema and increased warmth. Low suspicion for compartment syndrome given patient tolerance of exam intact and symmetrical pulses, and lack of severe swelling/hardness of tissue.  - Prescribe Keflex  to be taken every six hours for seven days. - Recommend Tylenol , not exceeding 3500 mg per 24 hours, with dosing of two 500 mg tablets three times a day. - Advise warm compresses to the affected area. - Instruct to avoid NSAIDs such as Advil  or ibuprofen  due to recent hospitalization for gastric ulcer - Advise to seek emergency care if there is increased redness, bruising, swelling, or if the area becomes indurated or hard raising concerns for compartment syndrome.     Discharge Instructions      VISIT SUMMARY:  You came in today  because of pain and swelling in your arm following an IV insertion a week ago. We examined the area and determined that you likely have cellulitis, which is an infection of the skin and tissues beneath it.  YOUR PLAN:  -CELLULITIS OF RIGHT UPPER LIMB: Cellulitis is an infection of the skin and tissues beneath it, often caused by bacteria. You have been prescribed Keflex  to take every six hours for seven days to treat the infection. For pain management, you can take Tylenol , but do not exceed 3500 mg in 24 hours; this means you can take two 500 mg tablets three times a day. Apply warm compresses to the affected area to help reduce swelling and discomfort. Avoid taking NSAIDs like Advil  or ibuprofen . If you notice increased redness, bruising, swelling, or if the area becomes hard, seek emergency care immediately.  INSTRUCTIONS:  Please follow up with your doctor if there is no improvement after completing the antibiotic course or if symptoms worsen. Seek emergency care if you notice increased redness, bruising, swelling, or if the area becomes hard.     ED Prescriptions     Medication Sig Dispense Auth. Provider   cephALEXin  (KEFLEX ) 500 MG capsule Take 1 capsule (500 mg total) by mouth 4 (four) times daily for 7 days. 28 capsule Yamilett Anastos E, [name]      PDMP not reviewed this encounter.   Chesley Valls, Rocky BRAVO, [name] 06/22/24 1619    Tin Engram, Rocky BRAVO, [name] 06/22/24 1631

## 2024-06-22 NOTE — ED Triage Notes (Signed)
 Right AC pain post IV removal from last Tuesday's hospitalization. Pain more intense with movement. Visible bruising present. No OTC meds taken or other remedies.

## 2024-06-25 ENCOUNTER — Telehealth (INDEPENDENT_AMBULATORY_CARE_PROVIDER_SITE_OTHER): Payer: Self-pay | Admitting: Primary Care

## 2024-06-25 NOTE — Telephone Encounter (Signed)
 Spoke to pt about upcoming appt.. Will be present

## 2024-06-26 ENCOUNTER — Telehealth: Payer: Self-pay

## 2024-06-26 ENCOUNTER — Encounter (INDEPENDENT_AMBULATORY_CARE_PROVIDER_SITE_OTHER): Payer: Self-pay | Admitting: Primary Care

## 2024-06-26 ENCOUNTER — Ambulatory Visit (INDEPENDENT_AMBULATORY_CARE_PROVIDER_SITE_OTHER): Payer: Self-pay | Admitting: Primary Care

## 2024-06-26 VITALS — BP 137/87 | HR 60 | Resp 16 | Wt 191.4 lb

## 2024-06-26 DIAGNOSIS — R030 Elevated blood-pressure reading, without diagnosis of hypertension: Secondary | ICD-10-CM

## 2024-06-26 DIAGNOSIS — Z09 Encounter for follow-up examination after completed treatment for conditions other than malignant neoplasm: Secondary | ICD-10-CM

## 2024-06-26 DIAGNOSIS — Z716 Tobacco abuse counseling: Secondary | ICD-10-CM

## 2024-06-26 DIAGNOSIS — F17210 Nicotine dependence, cigarettes, uncomplicated: Secondary | ICD-10-CM

## 2024-06-26 DIAGNOSIS — Z8719 Personal history of other diseases of the digestive system: Secondary | ICD-10-CM

## 2024-06-26 NOTE — Progress Notes (Signed)
 Subjective:   Jeremy Randall is a 53 y.o. male presents for hospital follow up .  Jeremy Randall has given his fiance Jeremy Randall to be present at his appointment and provide any information that he may leave about.  She did mention he was on antibiotics for a wound in his right antecubital area and placed on antibiotics.  Patient presented to the emergency room on 10/ 4/25 with palpitations (a fibrillationtrial,) and intractable nausea and vomiting with abdominal pain this has been persistent for the last 2 weeks off-and-on there were no precipitating factors.  Pain at that time was 6 out of 10 denied any admit date to the hospital this time.  Patient was discharged on 06/16/2024 in stable condition recommended soft diet for the next 2 to 3 days also needs to be seen by a dentist for a abscess that also cause nausea vomiting and abdominal pain.  Abdominal pain was located in the upper gastric area CT scan showed thickening of his distal stomach and duodenum possibility of a perforated peptic ulcer he will be discharged on Protonix .  Today he presents feeling a lot better.  Hospitalization patient has elevated blood pressure readings and fianc was checking blood pressures at home and brought the readings to this appointment. Past Medical History:  Diagnosis Date   Asthma     No Known Allergies  Current Outpatient Medications on File Prior to Visit  Medication Sig Dispense Refill   acetaminophen  (TYLENOL ) 500 MG tablet Take 500 mg by mouth every 6 (six) hours as needed for moderate pain (pain score 4-6).     Blood Pressure Monitor MISC Use 2 (two) times daily. 1 each 0   cephALEXin  (KEFLEX ) 500 MG capsule Take 1 capsule (500 mg total) by mouth 4 (four) times daily for 7 days. 28 capsule 0   dicyclomine  (BENTYL ) 20 MG tablet Take 1 tablet (20 mg total) by mouth 3 (three) times daily as needed for abdominal pain. 270 tablet 0   nicotine (NICODERM CQ - DOSED IN MG/24 HOURS) 14 mg/24hr patch Place 1 patch  (14 mg total) onto the skin daily. 28 patch 0   pantoprazole  (PROTONIX ) 40 MG tablet Take 1 tablet (40 mg total) by mouth 2 (two) times daily. 120 tablet 0   sucralfate  (CARAFATE ) 1 g tablet Take 1 tablet (1 g total) by mouth 4 (four) times daily -  with meals and at bedtime. 60 tablet 1   No current facility-administered medications on file prior to visit.    Review of System: ROS Comprehensive ROS Pertinent positive and negative noted in HPI   Objective:  BP 137/87   Pulse 60   Resp 16   Wt 191 lb 6.4 oz (86.8 kg)   SpO2 100%   BMI 24.57 kg/m   Filed Weights   06/26/24 0959  Weight: 191 lb 6.4 oz (86.8 kg)    Physical Exam Vitals reviewed.  Constitutional:      Appearance: He is obese.  HENT:     Head: Normocephalic.     Right Ear: Tympanic membrane and external ear normal.     Left Ear: Tympanic membrane and external ear normal.     Nose: Nose normal.  Eyes:     Extraocular Movements: Extraocular movements intact.     Pupils: Pupils are equal, round, and reactive to light.  Cardiovascular:     Rate and Rhythm: Normal rate and regular rhythm.  Pulmonary:     Effort: Pulmonary effort is normal.  Breath sounds: Normal breath sounds.  Abdominal:     General: Bowel sounds are normal. There is distension.     Palpations: Abdomen is soft.  Musculoskeletal:        General: Normal range of motion.  Skin:    General: Skin is warm and dry.  Neurological:     Mental Status: He is oriented to person, place, and time.  Psychiatric:        Mood and Affect: Mood normal.        Behavior: Behavior normal.        Thought Content: Thought content normal.        Judgment: Judgment normal.      Assessment:  Jeremy Randall was seen today for hospitalization follow-up.  Diagnoses and all orders for this visit:  Hospital discharge follow-up See HPI  H/O dental abscess  Tobacco abuse counseling - I have recommended complete cessation of tobacco use. I have discussed various  options available for assistance with tobacco cessation including over the counter methods (Nicotine gum, patch and lozenges). We also discussed prescription options (Chantix, Nicotine Inhaler / Nasal Spray).  Patient is on nicotine patches  Elevated blood pressure reading Blood pressure readings at home all the following systolic ranges from 1 swindled 122-145 and diastolic range from 68-98.  Diagnoses for hypertension is greater than 130/80.  Provided information and discussed how to prevent hypertension will not prescribe medication at this appointment however on follow-up if remains elevated will start blood pressure medication.    This note has been created with Education officer, environmental. Any transcriptional errors are unintentional.   No follow-ups on file.  Jeremy SHAUNNA Bohr, NP 06/26/2024, 10:03 AM

## 2024-06-26 NOTE — Patient Instructions (Signed)
 Hypertension, Adult Hypertension is another name for high blood pressure. High blood pressure forces your heart to work harder to pump blood. This can cause problems over time. There are two numbers in a blood pressure reading. There is a top number (systolic) over a bottom number (diastolic). It is best to have a blood pressure that is below 120/80. What are the causes? The cause of this condition is not known. Some other conditions can lead to high blood pressure. What increases the risk? Some lifestyle factors can make you more likely to develop high blood pressure: Smoking. Not getting enough exercise or physical activity. Being overweight. Having too much fat, sugar, calories, or salt (sodium) in your diet. Drinking too much alcohol. Other risk factors include: Having any of these conditions: Heart disease. Diabetes. High cholesterol. Kidney disease. Obstructive sleep apnea. Having a family history of high blood pressure and high cholesterol. Age. The risk increases with age. Stress. What are the signs or symptoms? High blood pressure may not cause symptoms. Very high blood pressure (hypertensive crisis) may cause: Headache. Fast or uneven heartbeats (palpitations). Shortness of breath. Nosebleed. Vomiting or feeling like you may vomit (nauseous). Changes in how you see. Very bad chest pain. Feeling dizzy. Seizures. How is this treated? This condition is treated by making healthy lifestyle changes, such as: Eating healthy foods. Exercising more. Drinking less alcohol. Your doctor may prescribe medicine if lifestyle changes do not help enough and if: Your top number is above 130. Your bottom number is above 80. Your personal target blood pressure may vary. Follow these instructions at home: Eating and drinking  If told, follow the DASH eating plan. To follow this plan: Fill one half of your plate at each meal with fruits and vegetables. Fill one fourth of your plate  at each meal with whole grains. Whole grains include whole-wheat pasta, brown rice, and whole-grain bread. Eat or drink low-fat dairy products, such as skim milk or low-fat yogurt. Fill one fourth of your plate at each meal with low-fat (lean) proteins. Low-fat proteins include fish, chicken without skin, eggs, beans, and tofu. Avoid fatty meat, cured and processed meat, or chicken with skin. Avoid pre-made or processed food. Limit the amount of salt in your diet to less than 1,500 mg each day. Do not drink alcohol if: Your doctor tells you not to drink. You are pregnant, may be pregnant, or are planning to become pregnant. If you drink alcohol: Limit how much you have to: 0-1 drink a day for women. 0-2 drinks a day for men. Know how much alcohol is in your drink. In the [place] one drink equals one 12 oz bottle of beer (355 mL), one 5 oz glass of wine (148 mL), or one 1 oz glass of hard liquor (44 mL). Lifestyle  Work with your doctor to stay at a healthy weight or to lose weight. Ask your doctor what the best weight is for you. Get at least 30 minutes of exercise that causes your heart to beat faster (aerobic exercise) most days of the week. This may include walking, swimming, or biking. Get at least 30 minutes of exercise that strengthens your muscles (resistance exercise) at least 3 days a week. This may include lifting weights or doing Pilates. Do not smoke or use any products that contain nicotine or tobacco. If you need help quitting, ask your doctor. Check your blood pressure at home as told by your doctor. Keep all follow-up visits. Medicines Take over-the-counter and prescription medicines  only as told by your doctor. Follow directions carefully. Do not skip doses of blood pressure medicine. The medicine does not work as well if you skip doses. Skipping doses also puts you at risk for problems. Ask your doctor about side effects or reactions to medicines that you should watch  for. Contact a doctor if: You think you are having a reaction to the medicine you are taking. You have headaches that keep coming back. You feel dizzy. You have swelling in your ankles. You have trouble with your vision. Get help right away if: You get a very bad headache. You start to feel mixed up (confused). You feel weak or numb. You feel faint. You have very bad pain in your: Chest. Belly (abdomen). You vomit more than once. You have trouble breathing. These symptoms may be an emergency. Get help right away. Call 911. Do not wait to see if the symptoms will go away. Do not drive yourself to the hospital. Summary Hypertension is another name for high blood pressure. High blood pressure forces your heart to work harder to pump blood. For most people, a normal blood pressure is less than 120/80. Making healthy choices can help lower blood pressure. If your blood pressure does not get lower with healthy choices, you may need to take medicine. This information is not intended to replace advice given to you by your health care provider. Make sure you discuss any questions you have with your health care provider. Document Revised: 06/15/2021 Document Reviewed: 06/15/2021 Elsevier Patient Education  2024 [employer].

## 2024-06-26 NOTE — Telephone Encounter (Signed)
 This RN returned pt's phone call at this time. Name and DOB verified. Seen at [facility] on 10/13 for bruising and swelling of right arm following IV removal. Given Keflex  for this. Taking four times a day for seven days. Pt states he is currently on day three of antibiotics. Symptoms are slowly improving but has not resolved. Pt is unsure if he should be concerned. Spoke with [place]  NP. Advised pt to finish antibiotic dose but return if symptoms began to worsen. Pt verbalized understanding. No further questions/concerns.

## 2024-07-10 ENCOUNTER — Ambulatory Visit (INDEPENDENT_AMBULATORY_CARE_PROVIDER_SITE_OTHER): Payer: Self-pay | Admitting: Primary Care

## 2024-07-10 VITALS — BP 136/87

## 2024-07-10 DIAGNOSIS — Z0130 Encounter for examination of blood pressure without abnormal findings: Secondary | ICD-10-CM

## 2024-07-10 NOTE — Progress Notes (Signed)
   Blood Pressure Recheck Visit  Name: Jeremy Randall MRN: [id number] Date of Birth: 1970-12-19  [name] presents today for Blood Pressure recheck with clinical support staff.  Order for BP recheck by Rosaline, ordered on 06/26/24.   BP Readings from Last 3 Encounters:  07/10/24 136/87  06/26/24 137/87  06/22/24 (!) 155/89    Current Outpatient Medications  Medication Sig Dispense Refill   acetaminophen  (TYLENOL ) 500 MG tablet Take 500 mg by mouth every 6 (six) hours as needed for moderate pain (pain score 4-6).     Blood Pressure Monitor MISC Use 2 (two) times daily. 1 each 0   dicyclomine  (BENTYL ) 20 MG tablet Take 1 tablet (20 mg total) by mouth 3 (three) times daily as needed for abdominal pain. 270 tablet 0   nicotine (NICODERM CQ - DOSED IN MG/24 HOURS) 14 mg/24hr patch Place 1 patch (14 mg total) onto the skin daily. 28 patch 0   pantoprazole  (PROTONIX ) 40 MG tablet Take 1 tablet (40 mg total) by mouth 2 (two) times daily. 120 tablet 0   sucralfate  (CARAFATE ) 1 g tablet Take 1 tablet (1 g total) by mouth 4 (four) times daily -  with meals and at bedtime. 60 tablet 1   No current facility-administered medications for this visit.    Hypertensive Medication Review: Patient states that they are not taking any hypertensive medications   Documentation of any medication adherence discrepancies: none  Provider Recommendation:  Spoke to [place] and she stated: bp is   Patient has been scheduled to follow up with Rosaline for 3 months    Patient has been given provider's recommendations and does not have any questions or concerns at this time. Patient will contact the office for any future questions or concerns.

## 2024-10-09 ENCOUNTER — Telehealth: Payer: Self-pay | Admitting: Primary Care

## 2024-10-09 NOTE — Telephone Encounter (Signed)
 Called pt but pt did not answer. I could not LVM because the mailbox was full. If pt does call back just make aware that I called to confirm appt. Text was sent with address of appt for 10/13/24.

## 2024-10-12 ENCOUNTER — Telehealth: Payer: Self-pay

## 2024-10-12 ENCOUNTER — Ambulatory Visit (INDEPENDENT_AMBULATORY_CARE_PROVIDER_SITE_OTHER): Payer: Self-pay | Admitting: Primary Care

## 2024-10-12 NOTE — Telephone Encounter (Signed)
 Contacted pt to cancel and reschedule appt due to the weather pt didn't answer lvm  If pt calls back please reschedule
